# Patient Record
Sex: Female | Born: 2000 | Race: Black or African American | Hispanic: No | Marital: Single | Smoking: Former smoker
Health system: Southern US, Community
[De-identification: ages and names within clinical notes are randomized; demographics above are authoritative.]

## PROBLEM LIST (undated history)

## (undated) DIAGNOSIS — J45909 Unspecified asthma, uncomplicated: Secondary | ICD-10-CM

## (undated) DIAGNOSIS — Q0701 Arnold-Chiari syndrome with spina bifida: Secondary | ICD-10-CM

## (undated) DIAGNOSIS — U071 COVID-19: Secondary | ICD-10-CM

## (undated) DIAGNOSIS — R519 Headache, unspecified: Secondary | ICD-10-CM

## (undated) DIAGNOSIS — G901 Familial dysautonomia [Riley-Day]: Secondary | ICD-10-CM

## (undated) DIAGNOSIS — R51 Headache: Secondary | ICD-10-CM

## (undated) DIAGNOSIS — G9600 Cerebrospinal fluid leak, unspecified: Secondary | ICD-10-CM

## (undated) HISTORY — DX: Headache: R51

## (undated) HISTORY — DX: Headache, unspecified: R51.9

---
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
## 2021-05-14 ENCOUNTER — Other Ambulatory Visit: Payer: Self-pay

## 2021-05-14 ENCOUNTER — Ambulatory Visit (INDEPENDENT_AMBULATORY_CARE_PROVIDER_SITE_OTHER): Payer: BC Managed Care – PPO | Admitting: Family Medicine

## 2021-05-14 ENCOUNTER — Encounter: Payer: Self-pay | Admitting: Family Medicine

## 2021-05-14 VITALS — BP 120/80 | HR 66 | Temp 98.0°F | Ht 64.5 in | Wt 149.8 lb

## 2021-05-14 DIAGNOSIS — G935 Compression of brain: Secondary | ICD-10-CM | POA: Diagnosis not present

## 2021-05-14 DIAGNOSIS — G901 Familial dysautonomia [Riley-Day]: Secondary | ICD-10-CM | POA: Diagnosis not present

## 2021-05-14 DIAGNOSIS — Z7689 Persons encountering health services in other specified circumstances: Secondary | ICD-10-CM

## 2021-05-14 DIAGNOSIS — R630 Anorexia: Secondary | ICD-10-CM | POA: Diagnosis not present

## 2021-05-14 DIAGNOSIS — F32A Depression, unspecified: Secondary | ICD-10-CM

## 2021-05-14 DIAGNOSIS — F419 Anxiety disorder, unspecified: Secondary | ICD-10-CM | POA: Diagnosis not present

## 2021-05-14 NOTE — Patient Instructions (Addendum)
Chiari Malformation  Chiari malformation (CM) is a type of brain abnormality that affects the parts of the brain called the cerebellum and the brain stem. The cerebellum is important for balance, and the brain stem is important for basic body functions, such as breathing and swallowing. Normally, the cerebellum is located in a space at the back of the skull, just above the opening in the skull (foramen magnum)where the spinal cord meets the brain stem. With CM, part of the cerebellum is located below the foramen magnum instead. The malformation can be mild with no or few symptoms, or it can be severe. CM can cause neck pain, headaches, balance problems, and other symptoms. What are the causes? CM is a condition that a person is born with (congenital). In rare cases, CM may also develop later in life, which is called acquired CM or secondary CM. These cases may be caused by a leak of the fluid that surrounds and protects the brain and spinal cord (cerebrospinal fluid), leading to low pressure. In acquired or secondary CM, abnormal pressure develops in the brain. This pushes the cerebellum down into the foramen magnum. What increases the risk? The following factors may make you more likely to develop this condition:  Being female.  Having a family history of CM. What are the signs or symptoms? Symptoms of this condition may vary depending on the severity of your CM. In some cases, there are no symptoms. In other cases, symptoms may come and go. The most common symptom is a severe headache in the back of the head. The headache:  May come and go.  May spread to your neck and shoulders.  May be worse when you cough, sneeze, or strain. Other symptoms include:  Difficulty balancing.  Loss of coordination.  Vision problems, such as double vision, tiny spots moving across your vision (floaters), or sensitivity to lights.  Trouble swallowing or speaking.  Muscle weakness.  Feeling dizzy or  fainting.  Ringing in the ears.  Tiredness (fatigue).  Tingling or burning sensations in the fingers or toes.  Hearing problems.  Vomiting.  Inability to control when you urinate (incontinence). How is this diagnosed? This condition may be evaluated with a medical history and physical exam. This may include tests to check your balance and nerves (neurological exam). You may also have imaging tests, such as a CT scan or an MRI. How is this treated? Treatment for this condition depends on the severity of your symptoms. You may be treated with:  Surgery to prevent the malformation from getting worse, or to treat severe symptoms or symptoms that are getting worse.  Medicines or alternative treatments to relieve headaches or neck pain. If you do not have symptoms, you may not need treatment. Follow these instructions at home: Medicines  Take over-the-counter and prescription medicines only as told by your health care provider.  Ask your health care provider if the medicine prescribed to you requires you to avoid driving or using machinery.  If you are taking blood pressure or heart medicine, get up slowly and take several minutes to sit and then stand. This can reduce dizziness. General instructions  If you feel like you might faint: ? Lie down right away and raise (elevate) your feet above the level of your heart. ? Breathe deeply and steadily. Wait until all of the symptoms have passed.  Ask your health care provider which activities are safe for you and if you have any activity restrictions.  Do not use any  products that contain nicotine or tobacco. These products include cigarettes, chewing tobacco, and vaping devices, such as e-cigarettes. If you need help quitting, ask your health care provider.  Drink enough fluid to keep your urine pale yellow.  Consider joining a CM support group.  Keep all follow-up visits. This is important. Where to find more  information  [employer] of Neurological Disorders and Stroke: [employer] Contact a health care provider if:  You have new symptoms.  Your symptoms get worse. Get help right away if:  You develop weakness or numbness in one or all of your limbs.  You develop dizziness, slurred speech, double vision, weakness, or numbness with a severe headache. Summary  A Chiari malformation is a condition in which part of the cerebellum moves down through the foramen magnum.  The malformation can be mild with no symptoms, or it can be severe.  In some cases, no treatment is needed. In others, medicines are used to treat headaches. Surgery is done in the worst of cases. This information is not intended to replace advice given to you by your health care provider. Make sure you discuss any questions you have with your health care provider. Document Revised: 08/20/2020 Document Reviewed: 08/20/2020 Elsevier Patient Education  2021 [place], Adult Feeling a certain amount of stress is normal. Stress helps our body and mind get ready to deal with the demands of life. Stress hormones can motivate you to do well at work and meet your responsibilities. However severe or long-lasting (chronic) stress can affect your mental and physical health. Chronic stress puts you at higher risk for anxiety, depression, and other health problems like digestive problems, muscle aches, heart disease, high blood pressure, and stroke. What are the causes? Common causes of stress include:  Demands from work, such as deadlines, feeling overworked, or having long hours.  Pressures at home, such as money issues, disagreements with a spouse, or parenting issues.  Pressures from major life changes, such as divorce, moving, loss of a loved one, or chronic illness. You may be at higher risk for stress-related problems if you do not get enough sleep, are in poor health, do not have emotional  support, or have a mental health disorder like anxiety or depression. How to recognize stress Stress can make you:  Have trouble sleeping.  Feel sad, anxious, irritable, or overwhelmed.  Lose your appetite.  Overeat or want to eat unhealthy foods.  Want to use drugs or alcohol. Stress can also cause physical symptoms, such as:  Sore, tense muscles, especially in the shoulders and neck.  Headaches.  Trouble breathing.  A faster heart rate.  Stomach pain, nausea, or vomiting.  Diarrhea or constipation.  Trouble concentrating. Follow these instructions at home: Lifestyle  Identify the source of your stress and your reaction to it. See a therapist who can help you change your reactions.  When there are stressful events: ? Talk about it with family, friends, or co-workers. ? Try to think realistically about stressful events and not ignore them or overreact. ? Try to find the positives in a stressful situation and not focus on the negatives. ? Cut back on responsibilities at work and home, if possible. Ask for help from friends or family members if you need it.  Find ways to cope with stress, such as: ? Meditation. ? Deep breathing. ? Yoga or tai chi. ? Progressive muscle relaxation. ? Doing art, playing music, or reading. ? Making time for fun activities. ?  Spending time with family and friends.  Get support from family, friends, or spiritual resources. Eating and drinking  Eat a healthy diet. This includes: ? Eating foods that are high in fiber, such as beans, whole grains, and fresh fruits and vegetables. ? Limiting foods that are high in fat and processed sugars, such as fried and sweet foods.  Do not skip meals or overeat.  Drink enough fluid to keep your urine pale yellow. Alcohol use  Do not drink alcohol if: ? Your health care provider tells you not to drink. ? You are pregnant, may be pregnant, or are planning to become pregnant.  Drinking alcohol is  a way some people try to ease their stress. This can be dangerous, so if you drink alcohol: ? Limit how much you use to:  0-1 drink a day for women.  0-2 drinks a day for men. ? Be aware of how much alcohol is in your drink. In the [place] one drink equals one 12 oz bottle of beer (355 mL), one 5 oz glass of wine (148 mL), or one 1 oz glass of hard liquor (44 mL). Activity  Include 30 minutes of exercise in your daily schedule. Exercise is a good stress reducer.  Include time in your day for an activity that you find relaxing. Try taking a walk, going on a bike ride, reading a book, or listening to music.  Schedule your time in a way that lowers stress, and keep a consistent schedule. Prioritize what is most important to get done.   General instructions  Get enough sleep. Try to go to sleep and get up at about the same time every day.  Take over-the-counter and prescription medicines only as told by your health care provider.  Do not use any products that contain nicotine or tobacco, such as cigarettes, e-cigarettes, and chewing tobacco. If you need help quitting, ask your health care provider.  Do not use drugs or smoke to cope with stress.  Keep all follow-up visits as told by your health care provider. This is important. Where to find support  Talk with your health care provider about stress management or finding a support group.  Find a therapist to work with you on your stress management techniques. Contact a health care provider if:  Your stress symptoms get worse.  You are unable to manage your stress at home.  You are struggling to stop using drugs or alcohol. Get help right away if:  You may be a danger to yourself or others.  You have any thoughts of death or suicide. If you ever feel like you may hurt yourself or others, or have thoughts about taking your own life, get help right away. You can go to your nearest emergency department or call:  Your local emergency  services (911 in the [place]  A suicide crisis helpline, such as the [facility] at 215-417-5585. This is open 24 hours a day. Summary  Feeling a certain amount of stress is normal, but severe or long-lasting (chronic) stress can affect your mental and physical health.  Chronic stress can put you at higher risk for anxiety, depression, and other health problems like digestive problems, muscle aches, heart disease, high blood pressure, and stroke.  You may be at higher risk for stress-related problems if you do not get enough sleep, are in poor health, lack emotional support, or have a mental health disorder like anxiety or depression.  Identify the source of your stress and  your reaction to it. Try talking about stressful events with family, friends, or co-workers, finding a coping method, or getting support from spiritual resources.  If you need more help, talk with your health care provider about finding a support group or a mental health therapist. This information is not intended to replace advice given to you by your health care provider. Make sure you discuss any questions you have with your health care provider. Document Revised: 07/04/2019 Document Reviewed: 07/04/2019 Elsevier Patient Education  [employer].

## 2021-05-14 NOTE — Progress Notes (Addendum)
Patient presents to clinic today to establish care and f/u on chronic concerns.  SUBJECTIVE: PMH: Pt is a 20 yo female with pmh sig for Chiari malformation type II, asthma, dysautonomia. Pt previously seen by Jolaine Click, MD.  Pt notes h/o increased problems after having surgery for Chiari Malformation in 2017.  Pt followed by [facility], Cardiology, and GI.  Pt notes h/o decreased appetite, energy, HAs, vomiting, abdominal pain, and depression following her health issues.  Pt feeling somewhat better, but still with decreased appetite.  Doesn't feel hungry.  Was 161 lbs, now 149.8 lbs.  Pt interested in seeing a nutritionist.  Allergies: apples and carrots cause throat to feel itchy. Peanut oil-hives Midodrine-hives Lactose intolerant  Social hx: Pt is a Consulting civil engineer at [employer]  She is studying Psychology.  Pt denies EtOH, tobacco and drug use.  Past Medical History:  Diagnosis Date  . Asthma   . Chiari malformation type II (HCC)    I or II per patient  . COVID-19   . CSF leak   . Dysautonomia (HCC)   . Headache     Past Surgical History:  Procedure Laterality Date  . Chiari Decompression  05/2016    Current Outpatient Medications on File Prior to Visit  Medication Sig Dispense Refill  . [DISCONTINUED] famotidine (PEPCID) 20 MG tablet Take 1 tablet (20 mg total) by mouth 2 (two) times daily. (Patient not taking: No sig reported) 30 tablet 0  . [DISCONTINUED] Levonorgestrel (SKYLA) 13.5 MG IUD 1 each by Intrauterine route once. Implanted 2nd week of March 2021 (replace every 5 years     No current facility-administered medications on file prior to visit.    Allergies  Allergen Reactions  . Apple Itching    Reaction: itchy throat per patient  . Carrot [Daucus Carota] Itching    Reaction: itchy throat per patient  . Lactose Intolerance (Gi) Diarrhea  . Midodrine Hives  . Peanut Oil Hives    Family History  Problem Relation Age of Onset  . Hypertension Mother   .  Cancer Father   . Hypertension Father   . High blood pressure Brother     Social History   Socioeconomic History  . Marital status: Single    Spouse name: Not on file  . Number of children: Not on file  . Years of education: Not on file  . Highest education level: Not on file  Occupational History  . Not on file  Tobacco Use  . Smoking status: Former Smoker    Types: E-cigarettes  . Smokeless tobacco: Never Used  Vaping Use  . Vaping Use: Former  Substance and Sexual Activity  . Alcohol use: No  . Drug use: No  . Sexual activity: Not on file  Other Topics Concern  . Not on file  Social History Narrative   [place] is in 11th grade at [employer]. She is doing well.    Lives with her parents and older brother.    Enjoys playing volleyball, playing guitar, and singing.    Social Determinants of Health   Financial Resource Strain: Not on file  Food Insecurity: Not on file  Transportation Needs: Not on file  Physical Activity: Not on file  Stress: Not on file  Social Connections: Not on file  Intimate Partner Violence: Not on file    ROS General: Denies fever, chills, night sweats, changes in weight +changes in appetite HEENT: Denies headaches, ear pain, changes in vision, rhinorrhea, sore throat  CV: Denies CP, palpitations, SOB, orthopnea Pulm: Denies SOB, cough, wheezing GI: Denies abdominal pain, nausea, vomiting, diarrhea, constipation GU: Denies dysuria, hematuria, frequency, vaginal discharge Msk: Denies muscle cramps, joint pains Neuro: Denies weakness, numbness, tingling Skin: Denies rashes, bruising Psych: Denies anxiety, hallucinations  +h/o depression  BP 120/80 (BP Location: Right Arm, Patient Position: Sitting, Cuff Size: Normal)   Pulse 66   Temp 98 F (36.7 C) (Oral)   Ht 5' 4.5" (1.638 m)   Wt 149 lb 12.8 oz (67.9 kg)   LMP 04/22/2021 (Exact Date)   SpO2 99%   BMI 25.32 kg/m   Physical Exam Gen. Pleasant, well developed,  well-nourished, in NAD HEENT - /AT, PERRL, EOMI, conjunctive clear, no scleral icterus, no nasal drainage, pharynx without erythema or exudate.  TMs normal b/l. Lungs: no use of accessory muscles, CTAB, no wheezes, rales or rhonchi Cardiovascular: RRR, No r/g/m, no peripheral edema Abdomen: BS present, soft, nontender,nondistended Musculoskeletal: No deformities, moves all four extremities, no cyanosis or clubbing, normal tone Neuro:  A&Ox3, CN II-XII intact, normal gait Skin:  Warm, dry, intact, without rash Psych: normal affect, mood appropriate  No results found for this or any previous visit (from the past 2160 hour(s)).  Assessment/Plan: Chiari malformation type I [facility]) -s/p surgery in 2017 -followed by [facility]  -will place referral for local Neurologist if desired.  Decreased appetite  -discussed trying to eat small meals throughout the day -can also try drinking supplemental shakes -consider MVI -discussed nutritionist - Plan: Ambulatory referral to diabetic education  Dysautonomia (HCC) -midodrine caused hives -followed by [facility] Cardiac Electrophysiology, Dr. Meryle Ready -will provide referral to adult Cardiologist in area if needed.  Anxiety and depression -PHQ 9 score 12 -GAD 7 score 10 -discussed various options including counseling and medication. -given info for area [facility] providers. -given precautions  Encounter to establish care -We reviewed the PMH, PSH, FH, SH, Meds and Allergies. -We provided refills for any medications we will prescribe as needed. -We addressed current concerns per orders and patient instructions. -We have asked for records for pertinent exams, studies, vaccines and notes from previous providers. -We have advised patient to follow up per instructions below.  F/u in 6-8 wks, sooner if needed.  Abbe Amsterdam, MD

## 2021-05-26 ENCOUNTER — Encounter: Payer: Self-pay | Admitting: Family Medicine

## 2021-05-26 DIAGNOSIS — R630 Anorexia: Secondary | ICD-10-CM | POA: Insufficient documentation

## 2021-05-26 DIAGNOSIS — G901 Familial dysautonomia [Riley-Day]: Secondary | ICD-10-CM | POA: Insufficient documentation

## 2021-05-26 DIAGNOSIS — F419 Anxiety disorder, unspecified: Secondary | ICD-10-CM | POA: Insufficient documentation

## 2021-06-18 ENCOUNTER — Ambulatory Visit: Payer: BC Managed Care – PPO | Admitting: Registered"

## 2021-07-08 ENCOUNTER — Ambulatory Visit (INDEPENDENT_AMBULATORY_CARE_PROVIDER_SITE_OTHER): Payer: BC Managed Care – PPO | Admitting: Family Medicine

## 2021-07-08 ENCOUNTER — Other Ambulatory Visit: Payer: Self-pay

## 2021-07-08 ENCOUNTER — Encounter: Payer: Self-pay | Admitting: Family Medicine

## 2021-07-08 VITALS — BP 104/72 | HR 78 | Temp 98.4°F | Ht 65.0 in | Wt 144.2 lb

## 2021-07-08 DIAGNOSIS — F419 Anxiety disorder, unspecified: Secondary | ICD-10-CM

## 2021-07-08 DIAGNOSIS — R1032 Left lower quadrant pain: Secondary | ICD-10-CM | POA: Diagnosis not present

## 2021-07-08 DIAGNOSIS — F32A Depression, unspecified: Secondary | ICD-10-CM

## 2021-07-08 DIAGNOSIS — R1012 Left upper quadrant pain: Secondary | ICD-10-CM | POA: Diagnosis not present

## 2021-07-08 DIAGNOSIS — Z13220 Encounter for screening for lipoid disorders: Secondary | ICD-10-CM

## 2021-07-08 DIAGNOSIS — Z0001 Encounter for general adult medical examination with abnormal findings: Secondary | ICD-10-CM | POA: Diagnosis not present

## 2021-07-08 DIAGNOSIS — R002 Palpitations: Secondary | ICD-10-CM | POA: Diagnosis not present

## 2021-07-08 DIAGNOSIS — Z0000 Encounter for general adult medical examination without abnormal findings: Secondary | ICD-10-CM

## 2021-07-08 LAB — COMPREHENSIVE METABOLIC PANEL
ALT: 14 U/L (ref 0–35)
AST: 22 U/L (ref 0–37)
Albumin: 4.1 g/dL (ref 3.5–5.2)
Alkaline Phosphatase: 49 U/L (ref 47–119)
BUN: 12 mg/dL (ref 6–23)
CO2: 23 mEq/L (ref 19–32)
Calcium: 9.1 mg/dL (ref 8.4–10.5)
Chloride: 109 mEq/L (ref 96–112)
Creatinine, Ser: 0.92 mg/dL (ref 0.40–1.20)
GFR: 90.35 mL/min (ref >60.00)
Glucose, Bld: 79 mg/dL (ref 70–99)
Potassium: 3.8 mEq/L (ref 3.5–5.1)
Sodium: 140 mEq/L (ref 135–145)
Total Bilirubin: 1.2 mg/dL (ref 0.2–1.2)
Total Protein: 7 g/dL (ref 6.0–8.3)

## 2021-07-08 LAB — LIPID PANEL
Cholesterol: 126 mg/dL (ref 0–200)
HDL: 60.9 mg/dL
LDL Cholesterol: 59 mg/dL (ref 0–99)
NonHDL: 65.04
Total CHOL/HDL Ratio: 2
Triglycerides: 32 mg/dL (ref 0.0–149.0)
VLDL: 6.4 mg/dL (ref 0.0–40.0)

## 2021-07-08 LAB — CBC WITH DIFFERENTIAL/PLATELET
Basophils Absolute: 0.1 10*3/uL (ref 0.0–0.1)
Basophils Relative: 1.2 % (ref 0.0–3.0)
Eosinophils Absolute: 0.1 10*3/uL (ref 0.0–0.7)
Eosinophils Relative: 2 % (ref 0.0–5.0)
HCT: 38 % (ref 36.0–49.0)
Hemoglobin: 12.6 g/dL (ref 12.0–16.0)
Lymphocytes Relative: 35.5 % (ref 24.0–48.0)
Lymphs Abs: 2.1 10*3/uL (ref 0.7–4.0)
MCHC: 33.3 g/dL (ref 31.0–37.0)
MCV: 83.4 fl (ref 78.0–98.0)
Monocytes Absolute: 0.5 10*3/uL (ref 0.1–1.0)
Monocytes Relative: 7.8 % (ref 3.0–12.0)
Neutro Abs: 3.1 10*3/uL (ref 1.4–7.7)
Neutrophils Relative %: 53.5 % (ref 43.0–71.0)
Platelets: 200 10*3/uL (ref 150.0–575.0)
RBC: 4.55 Mil/uL (ref 3.80–5.70)
RDW: 14.5 % (ref 11.4–15.5)
WBC: 5.9 10*3/uL (ref 4.5–13.5)

## 2021-07-08 LAB — POCT URINALYSIS DIPSTICK
Bilirubin, UA: NEGATIVE
Blood, UA: NEGATIVE
Glucose, UA: NEGATIVE
Ketones, UA: NEGATIVE
Leukocytes, UA: NEGATIVE
Protein, UA: POSITIVE — AB
Spec Grav, UA: 1.025
Urobilinogen, UA: NEGATIVE U/dL — AB
pH, UA: 6

## 2021-07-08 LAB — TSH: TSH: 1.61 u[IU]/mL (ref 0.40–5.00)

## 2021-07-08 LAB — T4, FREE: Free T4: 1.01 ng/dL (ref 0.60–1.60)

## 2021-07-08 LAB — HEMOGLOBIN A1C: Hgb A1c MFr Bld: 5.3 % (ref 4.6–6.5)

## 2021-07-08 NOTE — Progress Notes (Signed)
Subjective:     Chloe Sanders is a 20 y.o. female and is here for a comprehensive physical exam. Not fasting.  States doing ok.  Reports abd pain and nausea.  Notes cramping/pain prior to start of menses which typically last 4-5 days.  Stopped OCPs in January.  Endorses intermittent palpitations.  Denies CP.  Social History   Socioeconomic History   Marital status: Single    Spouse name: Not on file   Number of children: Not on file   Years of education: Not on file   Highest education level: Not on file  Occupational History   Not on file  Tobacco Use   Smoking status: Former    Types: E-cigarettes   Smokeless tobacco: Never  Vaping Use   Vaping Use: Former  Substance and Sexual Activity   Alcohol use: No   Drug use: No   Sexual activity: Not on file  Other Topics Concern   Not on file  Social History Narrative   Chloe Sanders is in 11th grade at [employer]. She is doing well.    Lives with her parents and older brother.    Enjoys playing volleyball, playing guitar, and singing.    Social Determinants of Health   Financial Resource Strain: Not on file  Food Insecurity: Not on file  Transportation Needs: Not on file  Physical Activity: Not on file  Stress: Not on file  Social Connections: Not on file  Intimate Partner Violence: Not on file   Health Maintenance  Topic Date Due   COVID-19 Vaccine (1) Never done   Pneumococcal Vaccine 4-46 Years old (1 - PCV) Never done   HPV VACCINES (1 - 2-dose series) Never done   HIV Screening  Never done   Hepatitis C Screening  Never done   TETANUS/TDAP  Never done   INFLUENZA VACCINE  07/20/2021    The following portions of the patient's history were reviewed and updated as appropriate: allergies, current medications, past family history, past medical history, past social history, past surgical history, and problem list.  Review of Systems Pertinent items noted in HPI and remainder of comprehensive ROS otherwise  negative.   Objective:    BP 104/72 (BP Location: Right Arm, Patient Position: Sitting, Cuff Size: Normal)   Pulse 78   Temp 98.4 F (36.9 C) (Oral)   Ht 5\' 5"  (1.651 m)   Wt 144 lb 3.2 oz (65.4 kg)   LMP 06/15/2021   SpO2 99%   BMI 24.00 kg/m  General appearance: alert, cooperative, and no distress Head: Normocephalic, without obvious abnormality, atraumatic Eyes: conjunctivae/corneas clear. PERRL, EOM's intact. Fundi benign. Ears: normal TM's and external ear canals both ears Nose: Nares normal. Septum midline. Mucosa normal. No drainage or sinus tenderness. Throat: lips, mucosa, and tongue normal; teeth and gums normal Neck: no adenopathy, no carotid bruit, no JVD, supple, symmetrical, trachea midline, and thyroid not enlarged, symmetric, no tenderness/mass/nodules Lungs: clear to auscultation bilaterally Heart: regular rate and rhythm, S1, S2 normal, no murmur, click, rub or gallop Abdomen:  BS present, soft, ND, TTP in LUQ and LLQ , no hepatosplenomegaly. Extremities: extremities normal, atraumatic, no cyanosis or edema Pulses: 2+ and symmetric Skin: Skin color, texture, turgor normal. No rashes or lesions Lymph nodes: Cervical, supraclavicular, and axillary nodes normal. Neurologic: Alert and oriented X 3, normal strength and tone. Normal symmetric reflexes. Normal coordination and gait    Assessment:    Healthy female exam with anxiety, depression, LUQ and LLQ pain.  Plan:    Anticipatory guidance given including wearing seatbelts, smoke detectors in the home, increasing physical activity, increasing p.o. intake of water and vegetables. -labs -given handout -next CPE in 1 yr See After Visit Summary for Counseling Recommendations   Palpitations -EKG obtained this visit NSR.  Compared to previous EKG from 01/18/2019. -Discussed limiting caffeine intake -Obtain labs - Plan: EKG 12-Lead, CBC with Differential/Platelet, TSH, T4, Free, Hemoglobin A1c  Anxiety and  depression  -PHQ 9 score 12 -GAD 7 score 14 -discussed self care, counseling, meds.  Give info on area [facility] providers -Wishes to wait on medications -given strict precautions -Follow-up in 4 weeks, sooner if needed - Plan: TSH, T4, Free  Left lower quadrant abdominal pain -Discussed possible causes including constipation -Bowel regimen if needed -For continued or worsening symptoms follow-up with GI - Plan: CMP, POCT urinalysis dipstick  Left upper quadrant abdominal pain  - Plan: CMP, POCT urinalysis dipstick  Screening for cholesterol level -not fasting. - Plan: Lipid panel  Follow-up in 4-6 weeks, sooner if needed for anxiety, depression  Abbe Amsterdam, MD

## 2021-07-29 ENCOUNTER — Encounter: Payer: Self-pay | Admitting: Family Medicine

## 2021-08-07 ENCOUNTER — Emergency Department (HOSPITAL_COMMUNITY)
Admission: EM | Admit: 2021-08-07 | Discharge: 2021-08-07 | Disposition: A | Discharge status: Home / Self Care | Payer: BC Managed Care – PPO | Attending: Emergency Medicine | Admitting: Emergency Medicine

## 2021-08-07 ENCOUNTER — Emergency Department (HOSPITAL_COMMUNITY): Payer: BC Managed Care – PPO

## 2021-08-07 ENCOUNTER — Encounter (HOSPITAL_COMMUNITY): Payer: Self-pay | Admitting: Emergency Medicine

## 2021-08-07 ENCOUNTER — Other Ambulatory Visit: Payer: Self-pay

## 2021-08-07 DIAGNOSIS — Z87891 Personal history of nicotine dependence: Secondary | ICD-10-CM | POA: Insufficient documentation

## 2021-08-07 DIAGNOSIS — J45909 Unspecified asthma, uncomplicated: Secondary | ICD-10-CM | POA: Diagnosis not present

## 2021-08-07 DIAGNOSIS — Y9368 Activity, volleyball (beach) (court): Secondary | ICD-10-CM | POA: Insufficient documentation

## 2021-08-07 DIAGNOSIS — S0990XA Unspecified injury of head, initial encounter: Secondary | ICD-10-CM | POA: Diagnosis not present

## 2021-08-07 DIAGNOSIS — Z91010 Allergy to peanuts: Secondary | ICD-10-CM | POA: Diagnosis not present

## 2021-08-07 DIAGNOSIS — S0501XA Injury of conjunctiva and corneal abrasion without foreign body, right eye, initial encounter: Secondary | ICD-10-CM | POA: Insufficient documentation

## 2021-08-07 DIAGNOSIS — W2106XA Struck by volleyball, initial encounter: Secondary | ICD-10-CM | POA: Diagnosis not present

## 2021-08-07 DIAGNOSIS — Z8616 Personal history of COVID-19: Secondary | ICD-10-CM | POA: Diagnosis not present

## 2021-08-07 DIAGNOSIS — S0591XA Unspecified injury of right eye and orbit, initial encounter: Secondary | ICD-10-CM | POA: Diagnosis present

## 2021-08-07 DIAGNOSIS — M62838 Other muscle spasm: Secondary | ICD-10-CM | POA: Diagnosis not present

## 2021-08-07 MED ORDER — METHOCARBAMOL 500 MG PO TABS: 500.0000 mg | ORAL_TABLET | Freq: Two times a day (BID) | ORAL | 0 refills | Status: DC

## 2021-08-07 MED ORDER — ACETAMINOPHEN 325 MG PO TABS
650.0000 mg | ORAL_TABLET | Freq: Once | ORAL | Status: AC
  Administered 2021-08-07: 650 mg via ORAL
  Filled 2021-08-07: qty 2

## 2021-08-07 MED ORDER — FLUORESCEIN SODIUM 1 MG OP STRP
1.0000 | ORAL_STRIP | Freq: Once | OPHTHALMIC | Status: AC
  Administered 2021-08-07: 1 via OPHTHALMIC
  Filled 2021-08-07: qty 1

## 2021-08-07 MED ORDER — LIDOCAINE 5 % EX PTCH: 1.0000 | MEDICATED_PATCH | CUTANEOUS | 0 refills | Status: DC

## 2021-08-07 MED ORDER — LIDOCAINE 5 % EX PTCH
1.0000 | MEDICATED_PATCH | CUTANEOUS | Status: DC
  Administered 2021-08-07: 1 via TRANSDERMAL
  Filled 2021-08-07: qty 1

## 2021-08-07 MED ORDER — ERYTHROMYCIN 5 MG/GM OP OINT: TOPICAL_OINTMENT | OPHTHALMIC | 0 refills | Status: DC

## 2021-08-07 NOTE — ED Provider Notes (Signed)
[facility] EMERGENCY DEPARTMENT Provider Note   CSN: [id number] Arrival date & time: 08/07/21  1329     History Chief Complaint  Patient presents with   Head Injury    Chloe Sanders is a 20 y.o. female.  Patient is a 20 yo female presenting for headache. Patient states this past Monday, 5 days ago, she was hit on the left side of her face/neck by a volley ball. States she went down but did not loose consciousness. States she has residual right sided neck pain with associated headache. Admits to intermittent blurred vision but denies eye pain or sensation of foreign body in eye.   The history is provided by the patient. No language interpreter was used.  Head Injury Location:  Generalized Pain details:    Quality:  Aching Chronicity:  New Relieved by:  Nothing Associated symptoms: headache and neck pain   Associated symptoms: no seizures and no vomiting       Past Medical History:  Diagnosis Date   Asthma    Chiari malformation type II (HCC)    I or II per patient   COVID-19    CSF leak    Dysautonomia (HCC)    Headache     Patient Active Problem List   Diagnosis Date Noted   Dysautonomia (HCC) 05/26/2021   Anxiety and depression 05/26/2021   Decreased appetite 05/26/2021   Cervical paraspinal muscle spasm 05/05/2017   Migraine without aura and without status migrainosus, not intractable 12/26/2015   Chiari malformation type I (HCC) 12/26/2015   Tension headache 12/26/2015    Past Surgical History:  Procedure Laterality Date   Chiari Decompression  05/2016     OB History   No obstetric history on file.     Family History  Problem Relation Age of Onset   Hypertension Mother    Cancer Father    Hypertension Father    High blood pressure Brother     Social History   Tobacco Use   Smoking status: Former    Types: E-cigarettes   Smokeless tobacco: Never  Building services engineer Use: Former  Substance Use Topics   Alcohol use: No    Drug use: No    Home Medications Prior to Admission medications   Medication Sig Start Date End Date Taking? Authorizing Provider  acetaminophen (TYLENOL) 325 MG tablet Take 650-975 mg by mouth every 6 (six) hours as needed for fever, mild pain or headache.   Yes [provider]  ibuprofen (ADVIL) 200 MG tablet Take 400-600 mg by mouth every 6 (six) hours as needed for headache, fever or mild pain.   Yes [provider]  famotidine (PEPCID) 20 MG tablet Take 1 tablet (20 mg total) by mouth 2 (two) times daily. Patient not taking: No sig reported 01/26/20 12/17/20  Lorin Picket, NP  Levonorgestrel (SKYLA) 13.5 MG IUD 1 each by Intrauterine route once. Implanted 2nd week of March 2021 (replace every 5 years  12/17/20  [provider]    Allergies    Apple, Carrot [daucus carota], Lactose intolerance (gi), Midodrine, and Peanut oil  Review of Systems   Review of Systems  Constitutional:  Negative for chills and fever.  HENT:  Negative for ear pain and sore throat.   Eyes:  Positive for visual disturbance. Negative for pain.  Respiratory:  Negative for cough and shortness of breath.   Cardiovascular:  Negative for chest pain and palpitations.  Gastrointestinal:  Negative for abdominal  pain and vomiting.  Genitourinary:  Negative for dysuria and hematuria.  Musculoskeletal:  Positive for neck pain. Negative for arthralgias and back pain.  Skin:  Negative for color change and rash.  Neurological:  Positive for headaches. Negative for seizures and syncope.  All other systems reviewed and are negative.  Physical Exam Updated Vital Signs BP 130/80 (BP Location: Right Arm)   Pulse 67   Temp 97.9 F (36.6 C) (Oral)   Resp 18   LMP 08/04/2021   SpO2 100%   Physical Exam Vitals and nursing note reviewed.  Constitutional:      General: She is not in acute distress.    Appearance: She is well-developed.  HENT:     Head: Normocephalic and atraumatic.   Eyes:     General: Lids are normal. Vision grossly intact. Gaze aligned appropriately.     Conjunctiva/sclera: Conjunctivae normal.     Pupils: Pupils are equal, round, and reactive to light.     Right eye: Fluorescein uptake present.     Visual Fields: Right eye visual fields normal and left eye visual fields normal.     Comments: Corneal abrasion right eye   Cardiovascular:     Rate and Rhythm: Normal rate and regular rhythm.     Heart sounds: No murmur heard. Pulmonary:     Effort: Pulmonary effort is normal. No respiratory distress.     Breath sounds: Normal breath sounds.  Abdominal:     Palpations: Abdomen is soft.     Tenderness: There is no abdominal tenderness.  Musculoskeletal:     Cervical back: Neck supple.  Skin:    General: Skin is warm and dry.  Neurological:     Mental Status: She is alert.     GCS: GCS eye subscore is 4. GCS verbal subscore is 5. GCS motor subscore is 6.     Cranial Nerves: Cranial nerves are intact.     Sensory: Sensation is intact.     Motor: Motor function is intact.     Coordination: Coordination is intact.    ED Results / Procedures / Treatments   Labs (all labs ordered are listed, but only abnormal results are displayed) Labs Reviewed - No data to display  EKG None  Radiology CT HEAD WO CONTRAST ( )  Result Date: 08/07/2021 CLINICAL DATA:  Head trauma, mod-severe. Headache and eye pain after being hip with volleyball 4 days ago. History of prior decompression for Chiari malformation. EXAM: CT HEAD WITHOUT CONTRAST TECHNIQUE: Contiguous axial images were obtained from the base of the skull through the vertex without intravenous contrast. COMPARISON:  01/15/2019 FINDINGS: Brain: No evidence of acute infarction, hemorrhage, hydrocephalus, extra-axial collection or mass lesion/mass effect. Vascular: No hyperdense vessel or unexpected calcification. Skull: Prior suboccipital craniotomy for Chiari malformation. Negative for acute  calvarial fracture. Sinuses/Orbits: No acute finding. Other: Negative for scalp hematoma. IMPRESSION: No acute intracranial findings. Electronically Signed   By: Duanne Guess D.O.   On: 08/07/2021 15:06    Procedures Procedures   Medications Ordered in ED Medications - No data to display  ED Course  I have reviewed the triage vital signs and the nursing notes.  Pertinent labs & imaging results that were available during my care of the patient were reviewed by me and considered in my medical decision making (see chart for details).    MDM Rules/Calculators/A&P  6:16 PM 20 yo female presenting for headache and blurred vision after volley ball injury. Patient is Aox3, no acute distress, afebrile, with stable vitals. Physical exam demonstrates no neurovascular deficits. CT head stable. Right eye demonstrates corneal abrasion with fluorescein staining likely cause of blurred vision. No pain with EOM. No ecchymosis or swelling or orbits. Prescription for erythrocin ointment and follow up with ophthalmology provided.   Patient also had right sided cervical muscle spasm. Robaxin and motrin provided.   Patient in no distress and overall condition improved here in the ED. Detailed discussions were had with the patient regarding current findings, and need for close f/u with PCP or on call doctor. The patient has been instructed to return immediately if the symptoms worsen in any way for re-evaluation. Patient verbalized understanding and is in agreement with current care plan. All questions answered prior to discharge.    Final Clinical Impression(s) / ED Diagnoses Final diagnoses:  Injury of head, initial encounter  Cervical Muscle spasm  Abrasion of right cornea, initial encounter    Rx / DC Orders ED Discharge Orders     None        Franne Forts, DO 08/08/21 0123

## 2021-08-07 NOTE — ED Notes (Signed)
Pt verbalized understanding of d/c instructions, meds and followup care. Denies questions. VSS, no distress noted. Steady gait to exit.  

## 2021-08-07 NOTE — ED Notes (Signed)
EDP with woods lamp, fluorescein at this time. RN gave APAP PO. Pt denies needs, no distress.

## 2021-08-07 NOTE — ED Triage Notes (Signed)
Patient hit in face on right eye with a volleyball on Monday, continued to play after impact. Now complains of bilateral eye pain, headache, and nausea. Patient alert, oriented, and ambulatory at this time. Reports getting hit in the head five weeks ago with a volleyball and experiencing similar symptoms that resolved on their own.This time symptoms are worse.

## 2021-08-07 NOTE — ED Provider Notes (Signed)
Emergency Medicine Provider Triage Evaluation Note  Chloe Sanders , a 20 y.o. female  was evaluated in triage.    Patient with history of Chiari malformation s/p surgery presented today with headache since Monday now on day 5 of headache.  She states has been getting progressively worse.  She states that it occurred right after she was bending head with a volleyball.  She states she initially attempted to sleep off the pain however feels it is getting much worse.  Denies any weakness or numbness or vision changes.  Has a history of lumbar puncture however this was done much earlier in the year.  Review of Systems  Positive: headache Negative: fever  Physical Exam  BP (!) 135/91 (BP Location: Right Arm)   Pulse 64   Temp 98.6 F (37 C) (Oral)   Resp 14   LMP 08/04/2021   SpO2 100%  Gen:   Awake, no distress   Resp:  Normal effort  MSK:   Moves extremities without difficulty  Other:  Smile symmetric.  Moves all 4 extremities with appropriate and symmetric strength.  Speaking full sentences.  ANO x3 sensation in all 4 extremities is symmetric.  Medical Decision Making  Medically screening exam initiated at 1:41 PM.  Appropriate orders placed.  Chloe Sanders was informed that the remainder of the evaluation will be completed by another provider, this initial triage assessment does not replace that evaluation, and the importance of remaining in the ED until their evaluation is complete.  Patient with history of Chiari malformation s/p surgery presented today with headache since Monday now on day 5 of headache.  She states has been getting progressively worse.  She states that it occurred right after she was bending head with a volleyball.  She states she initially attempted to sleep off the pain however feels it is getting much worse.  Denies any weakness or numbness or vision changes.  Has a history of lumbar puncture however this was done much earlier in the year.   Gailen Shelter,  [place] 08/07/21 1346    Cheryll Cockayne, MD 08/07/21 1445

## 2021-10-16 ENCOUNTER — Encounter (HOSPITAL_BASED_OUTPATIENT_CLINIC_OR_DEPARTMENT_OTHER): Payer: Self-pay | Admitting: Emergency Medicine

## 2021-10-16 ENCOUNTER — Other Ambulatory Visit: Payer: Self-pay

## 2021-10-16 ENCOUNTER — Emergency Department (HOSPITAL_BASED_OUTPATIENT_CLINIC_OR_DEPARTMENT_OTHER)
Admission: EM | Admit: 2021-10-16 | Discharge: 2021-10-16 | Disposition: A | Discharge status: Home / Self Care | Payer: BC Managed Care – PPO | Attending: Emergency Medicine | Admitting: Emergency Medicine

## 2021-10-16 DIAGNOSIS — Z8616 Personal history of COVID-19: Secondary | ICD-10-CM | POA: Diagnosis not present

## 2021-10-16 DIAGNOSIS — Z87891 Personal history of nicotine dependence: Secondary | ICD-10-CM | POA: Insufficient documentation

## 2021-10-16 DIAGNOSIS — Z91010 Allergy to peanuts: Secondary | ICD-10-CM | POA: Diagnosis not present

## 2021-10-16 DIAGNOSIS — J029 Acute pharyngitis, unspecified: Secondary | ICD-10-CM | POA: Diagnosis not present

## 2021-10-16 DIAGNOSIS — Z20822 Contact with and (suspected) exposure to covid-19: Secondary | ICD-10-CM | POA: Insufficient documentation

## 2021-10-16 DIAGNOSIS — J45909 Unspecified asthma, uncomplicated: Secondary | ICD-10-CM | POA: Insufficient documentation

## 2021-10-16 DIAGNOSIS — J3489 Other specified disorders of nose and nasal sinuses: Secondary | ICD-10-CM | POA: Diagnosis not present

## 2021-10-16 DIAGNOSIS — R519 Headache, unspecified: Secondary | ICD-10-CM | POA: Diagnosis not present

## 2021-10-16 DIAGNOSIS — R509 Fever, unspecified: Secondary | ICD-10-CM | POA: Insufficient documentation

## 2021-10-16 DIAGNOSIS — J069 Acute upper respiratory infection, unspecified: Secondary | ICD-10-CM

## 2021-10-16 DIAGNOSIS — R059 Cough, unspecified: Secondary | ICD-10-CM | POA: Diagnosis not present

## 2021-10-16 LAB — RESP PANEL BY RT-PCR (FLU A&B, COVID) ARPGX2
Influenza A by PCR: NEGATIVE
Influenza B by PCR: NEGATIVE
SARS Coronavirus 2 by RT PCR: NEGATIVE

## 2021-10-16 MED ORDER — DIPHENHYDRAMINE HCL 50 MG/ML IJ SOLN
50.0000 mg | Freq: Once | INTRAMUSCULAR | Status: AC
  Administered 2021-10-16: 50 mg via INTRAVENOUS
  Filled 2021-10-16: qty 1

## 2021-10-16 MED ORDER — METOCLOPRAMIDE HCL 5 MG/ML IJ SOLN
10.0000 mg | Freq: Once | INTRAMUSCULAR | Status: AC
  Administered 2021-10-16: 10 mg via INTRAVENOUS
  Filled 2021-10-16: qty 2

## 2021-10-16 MED ORDER — BENZONATATE 100 MG PO CAPS: 100.0000 mg | ORAL_CAPSULE | Freq: Three times a day (TID) | ORAL | 0 refills | Status: DC

## 2021-10-16 NOTE — Discharge Instructions (Signed)
You came to the emergency department today to be evaluated for your headache, runny nose, cough and sore throat.  Your COVID-19 and influenza test were negative.  Your symptoms are likely due to a viral upper respiratory infection.  I have given you prescription for Tessalon to help with your cough, please take as prescribed.  Please take Ibuprofen (Advil, motrin) and Tylenol (acetaminophen) to relieve your pain.    You may take up to 600 MG (3 pills) of normal strength ibuprofen every 8 hours as needed.   You make take tylenol, up to 1,000 mg (two extra strength pills) every 8 hours as needed.   It is safe to take ibuprofen and tylenol at the same time as they work differently.   Do not take more than 3,000 mg tylenol in a 24 hour period (not more than one dose every 8 hours.  Please check all medication labels as many medications such as pain and cold medications may contain tylenol.  Do not drink alcohol while taking these medications.  Do not take other NSAID'S while taking ibuprofen (such as aleve or naproxen).  Please take ibuprofen with food to decrease stomach upset.  Get help right away if: You have shortness of breath that gets worse. You have severe or persistent: Headache. Ear pain. Sinus pain. Chest pain. You have chronic lung disease along with any of the following: Wheezing. Prolonged cough. Coughing up blood. A change in your usual mucus. You have a stiff neck. You have changes in your: Vision. Hearing. Thinking. Mood.

## 2021-10-16 NOTE — ED Notes (Signed)
Patient verbalizes understanding of discharge instructions. Opportunity for questioning and answers were provided. Patient discharged from ED.  °

## 2021-10-16 NOTE — ED Provider Notes (Addendum)
[facility] EMERGENCY DEPT Provider Note   CSN: [id number] Arrival date & time: 10/16/21  0941     History Chief Complaint  Patient presents with   Headache   Fever    Chloe Sanders is a 20 y.o. female with a history of asthma, Chiari malformation type II, dysautonomia.  Presents to the emergency department with a chief complaint of headache, rhinorrhea, sore throat, and cough.  Patient reports that her symptoms started on 10/24.  Symptoms have been constant since then.  Patient reports that headache is generalized throughout her entire head.  At onset pain was gradual and progressively worse over time.  At present patient rates pain 9/10 on the pain scale.  Patient endorses phobia.  Patient denies any change in pain with exertion.  Reports minimal relief of symptoms with Tylenol and Motrin.  Denies any numbness, weakness, visual disturbance, facial asymmetry, dysarthria.  No recent falls or injuries.  Patient endorses productive cough.  Cough is producing yellow mucus.  Endorses rhinorrhea, sore throat, and fever.  Patient reports that sore throat is worse in the morning.  Patient denies any trouble swallowing, drooling, high potato voice, or trismus.  T-max of 100 F temporally this morning.  Patient has been vaccinated for COVID-19 and received booster.  Patient has not been vaccinated for influenza.  Patient reports that her father was recently sick with similar symptoms however he was negative for COVID-19 and influenza.  LMP 3 weeks prior.  Patient reports she is not sexually active.   Headache Associated symptoms: cough, fever and sore throat   Associated symptoms: no abdominal pain, no back pain, no congestion, no diarrhea, no dizziness, no nausea, no neck pain, no numbness, no seizures, no vomiting and no weakness   Fever Associated symptoms: cough, headaches, rhinorrhea and sore throat   Associated symptoms: no chest pain, no chills, no confusion, no congestion,  no diarrhea, no dysuria, no nausea, no rash and no vomiting       Past Medical History:  Diagnosis Date   Asthma    Chiari malformation type II (HCC)    I or II per patient   COVID-19    CSF leak    Dysautonomia (HCC)    Headache     Patient Active Problem List   Diagnosis Date Noted   Dysautonomia (HCC) 05/26/2021   Anxiety and depression 05/26/2021   Decreased appetite 05/26/2021   Cervical paraspinal muscle spasm 05/05/2017   Migraine without aura and without status migrainosus, not intractable 12/26/2015   Chiari malformation type I (HCC) 12/26/2015   Tension headache 12/26/2015    Past Surgical History:  Procedure Laterality Date   Chiari Decompression  05/2016     OB History   No obstetric history on file.     Family History  Problem Relation Age of Onset   Hypertension Mother    Cancer Father    Hypertension Father    High blood pressure Brother     Social History   Tobacco Use   Smoking status: Former    Types: E-cigarettes   Smokeless tobacco: Never  Building services engineer Use: Former  Substance Use Topics   Alcohol use: No   Drug use: No    Home Medications Prior to Admission medications   Medication Sig Start Date End Date Taking? Authorizing Provider  acetaminophen (TYLENOL) 325 MG tablet Take 650-975 mg by mouth every 6 (six) hours as needed for fever, mild pain or headache.    [provider]  erythromycin ophthalmic ointment Place a 1/2 inch ribbon of ointment into the lower eyelid. 08/07/21   Edwin Dada P, DO  ibuprofen (ADVIL) 200 MG tablet Take 400-600 mg by mouth every 6 (six) hours as needed for headache, fever or mild pain.    [provider]  lidocaine (LIDODERM) 5 % Place 1 patch onto the skin daily. Remove & Discard patch within 12 hours or as directed by MD 08/07/21   Edwin Dada P, DO  methocarbamol (ROBAXIN) 500 MG tablet Take 1 tablet (500 mg total) by mouth 2 (two) times daily. 08/07/21   Edwin Dada P, DO   famotidine (PEPCID) 20 MG tablet Take 1 tablet (20 mg total) by mouth 2 (two) times daily. Patient not taking: No sig reported 01/26/20 12/17/20  Lorin Picket, NP  Levonorgestrel (SKYLA) 13.5 MG IUD 1 each by Intrauterine route once. Implanted 2nd week of March 2021 (replace every 5 years  12/17/20  [provider]    Allergies    Apple, Carrot [daucus carota], Lactose intolerance (gi), Midodrine, and Peanut oil  Review of Systems   Review of Systems  Constitutional:  Positive for fever. Negative for chills.  HENT:  Positive for rhinorrhea and sore throat. Negative for congestion, drooling, trouble swallowing and voice change.   Eyes:  Negative for visual disturbance.  Respiratory:  Positive for cough. Negative for shortness of breath.   Cardiovascular:  Negative for chest pain, palpitations and leg swelling.  Gastrointestinal:  Negative for abdominal pain, diarrhea, nausea and vomiting.  Genitourinary:  Negative for difficulty urinating, dysuria, frequency, genital sores, hematuria and urgency.  Musculoskeletal:  Negative for back pain and neck pain.  Skin:  Negative for color change and rash.  Neurological:  Positive for headaches. Negative for dizziness, tremors, seizures, syncope, facial asymmetry, speech difficulty, weakness, light-headedness and numbness.  Psychiatric/Behavioral:  Negative for confusion.    Physical Exam Updated Vital Signs BP 116/78   Pulse 68   Temp 98.3 F (36.8 C) (Oral)   Resp 18   Ht 5\' 4"  (1.626 m)   Wt 66.7 kg   SpO2 100%   BMI 25.23 kg/m   Physical Exam Vitals and nursing note reviewed.  Constitutional:      General: She is not in acute distress.    Appearance: She is ill-appearing. She is not toxic-appearing or diaphoretic.  HENT:     Head: Normocephalic and atraumatic.     Jaw: There is normal jaw occlusion. No trismus, tenderness, swelling, pain on movement or malocclusion.     Mouth/Throat:     Lips: Pink. No lesions.      Mouth: Mucous membranes are moist. No angioedema.     Tongue: No lesions. Tongue does not deviate from midline.     Palate: No mass and lesions.     Pharynx: Oropharynx is clear. Uvula midline. No pharyngeal swelling, oropharyngeal exudate, posterior oropharyngeal erythema or uvula swelling.     Tonsils: No tonsillar exudate or tonsillar abscesses. 1+ on the right. 1+ on the left.  Eyes:     General: No scleral icterus.       Right eye: No discharge.        Left eye: No discharge.     Extraocular Movements: Extraocular movements intact.     Pupils: Pupils are equal, round, and reactive to light.  Cardiovascular:     Rate and Rhythm: Normal rate.  Pulmonary:     Effort: Pulmonary effort is normal. No tachypnea,  bradypnea or respiratory distress.     Breath sounds: Normal breath sounds. No stridor.  Abdominal:     Palpations: Abdomen is soft.     Tenderness: There is no abdominal tenderness. There is no guarding or rebound.  Musculoskeletal:     Cervical back: Normal range of motion and neck supple. No rigidity.  Skin:    General: Skin is warm and dry.  Neurological:     General: No focal deficit present.     Mental Status: She is alert and oriented to person, place, and time.     GCS: GCS eye subscore is 4. GCS verbal subscore is 5. GCS motor subscore is 6.     Cranial Nerves: Cranial nerves 2-12 are intact. No cranial nerve deficit, dysarthria or facial asymmetry.     Sensory: Sensation is intact.     Motor: No weakness, tremor, seizure activity or pronator drift.     Coordination: Romberg sign negative. Finger-Nose-Finger Test normal.     Gait: Gait is intact. Gait normal.     Comments: CN II-XII intact, equal grip strength, +5 strength to bilateral upper and lower extremities, sensation to light touch is intact to bilateral upper and lower extremities.  Psychiatric:        Behavior: Behavior is cooperative.    ED Results / Procedures / Treatments   Labs (all labs ordered are  listed, but only abnormal results are displayed) Labs Reviewed  RESP PANEL BY RT-PCR (FLU A&B, COVID) ARPGX2    EKG None  Radiology No results found.  Procedures Procedures   Medications Ordered in ED Medications  metoCLOPramide (REGLAN) injection 10 mg (10 mg Intravenous Given 10/16/21 1457)  diphenhydrAMINE (BENADRYL) injection 50 mg (50 mg Intravenous Given 10/16/21 1457)    ED Course  I have reviewed the triage vital signs and the nursing notes.  Pertinent labs & imaging results that were available during my care of the patient were reviewed by me and considered in my medical decision making (see chart for details).    MDM Rules/Calculators/A&P                           Alert 20 year old female in no acute distress, nontoxic-appearing.  Presents to ED with chief complaint of headache, rhinorrhea, cough, and sore throat.  Headache onset was gradual and pain progressively worse over time.  No change in pain with exertion.  Patient denies any recent falls or injuries.  Neuro exam is reassuring.  Low suspicion for [facility] at this time.  We will give patient migraine cocktail and reassess.  After receiving migraine cocktail patient reports resolution of her headache.  Lungs clear to auscultation bilaterally, low suspicion for pneumonia at this time.  Patient has no tonsillar swelling, erythema, or exudate.  Low suspicion for strep pharyngitis at this time.  With patient's consolation of symptoms suspect possible viral infection.  Patient negative for COVID-19 and influenza.  Suspect viral upper respiratory infection.  Patient prescribed Tessalon and advised to use over-the-counter symptomatic medication as needed.  Patient to follow-up with primary care provider if symptoms do not improve.  Discussed results, findings, treatment and follow up. Patient advised of return precautions. Patient verbalized understanding and agreed with plan.   Final Clinical Impression(s) / ED  Diagnoses Final diagnoses:  None    Rx / DC Orders ED Discharge Orders     None        Haskel Schroeder, PA-C 10/16/21 1537  Haskel Schroeder, PA-C 10/16/21 1538    Vanetta Mulders, MD 10/21/21 [phone]

## 2021-10-16 NOTE — ED Triage Notes (Signed)
Pt arrives to ED with c/o of headache, sneezing, sore throat, cough x3 days ago on 10/24. No SOB.

## 2022-06-07 ENCOUNTER — Telehealth: Payer: Self-pay | Admitting: Family Medicine

## 2022-06-07 ENCOUNTER — Telehealth: Payer: BC Managed Care – PPO | Admitting: Family Medicine

## 2022-06-07 NOTE — Telephone Encounter (Signed)
I called the Chloe Sanders to inform her that the appt for today at 3:30 pm was cancelled, as Dr. Salomon Fick would rather see her in person. I left a message asking Chloe Sanders to call to reschedule. I also sent Chloe Sanders an email with the same information.(IC)

## 2022-08-11 ENCOUNTER — Ambulatory Visit (INDEPENDENT_AMBULATORY_CARE_PROVIDER_SITE_OTHER): Payer: BC Managed Care – PPO | Admitting: Family Medicine

## 2022-08-11 ENCOUNTER — Encounter: Payer: Self-pay | Admitting: Family Medicine

## 2022-08-11 VITALS — BP 126/80 | HR 82 | Temp 98.8°F | Wt 154.6 lb

## 2022-08-11 DIAGNOSIS — Z8659 Personal history of other mental and behavioral disorders: Secondary | ICD-10-CM

## 2022-08-11 DIAGNOSIS — F322 Major depressive disorder, single episode, severe without psychotic features: Secondary | ICD-10-CM

## 2022-08-11 DIAGNOSIS — F411 Generalized anxiety disorder: Secondary | ICD-10-CM | POA: Diagnosis not present

## 2022-08-11 MED ORDER — VENLAFAXINE HCL ER 75 MG PO CP24: 75.0000 mg | ORAL_CAPSULE | Freq: Every day | ORAL | 1 refills | Status: DC

## 2022-08-11 NOTE — Patient Instructions (Addendum)
A prescription for Keflex for was sent to your pharmacy.  This is a once a day pill.  We will have you follow-up with me next 2-4 weeks to see how things are going.  If you develop any suicidal thoughts call 911.

## 2022-08-11 NOTE — Progress Notes (Signed)
Subjective:    Patient ID: Antionette Poles, female    DOB: 06/30/2001, 21 y.o.   MRN: [id number]  Chief Complaint  Patient presents with   Anxiety    Has a lot going on in life. Any possible scenario will cause anxiety. Second guesses every thing she does and thinks.     HPI Patient was seen today for ongoing concern.  Pt states she moved to [place] for school but has not been able to enjoy it fully.  Pt noted increased anxiety and depression symptoms.  In counseling.  Notes anxiety all the time, feels it when wakes up, making it difficult to leave the house and making depression worse.  Pt endorses SI.  Wants to try medication.  States in the past zoloft and lexapro caused SI.  Pt endorses h/o eating do.  Past Medical History:  Diagnosis Date   Asthma    Chiari malformation type II (HCC)    I or II per patient   COVID-19    CSF leak    Dysautonomia (HCC)    Headache     Allergies  Allergen Reactions   Apple Juice Itching    Reaction: itchy throat per patient   Carrot [Daucus Carota] Itching    Reaction: itchy throat per patient   Lactose Intolerance (Gi) Diarrhea   Midodrine Hives   Peanut Oil Hives    ROS General: Denies fever, chills, night sweats, changes in weight, changes in appetite HEENT: Denies headaches, ear pain, changes in vision, rhinorrhea, sore throat CV: Denies CP, palpitations, SOB, orthopnea Pulm: Denies SOB, cough, wheezing GI: Denies abdominal pain, nausea, vomiting, diarrhea, constipation GU: Denies dysuria, hematuria, frequency, vaginal discharge Msk: Denies muscle cramps, joint pains Neuro: Denies weakness, numbness, tingling Skin: Denies rashes, bruising Psych: Denies depression, anxiety, hallucinations  +anxiety, depression, SI    Objective:    Blood pressure 126/80, pulse 82, temperature 98.8 F (37.1 C), temperature source Oral, weight 154 lb 9.6 oz (70.1 kg), SpO2 99 %.  Gen. Pleasant, well-nourished, in no distress, normal affect    HEENT: [place]/AT, face symmetric, conjunctiva clear, no scleral icterus, PERRLA, EOMI, nares patent without drainage Lungs: no accessory muscle use, CTAB Cardiovascular: RRR, no peripheral edema Neuro:  A&Ox3, CN II-XII intact, normal gait Skin:  Warm, no lesions/ rash   Wt Readings from Last 3 Encounters:  10/16/21 147 lb (66.7 kg) (78 %, Z= 0.76)*  07/08/21 144 lb 3.2 oz (65.4 kg) (75 %, Z= 0.69)*  05/14/21 149 lb 12.8 oz (67.9 kg) (81 %, Z= 0.89)*   * Growth percentiles are based on CDC (Girls, 2-20 Years) data.    Lab Results  Component Value Date   WBC 5.9 07/08/2021   HGB 12.6 07/08/2021   HCT 38.0 07/08/2021   PLT 200.0 07/08/2021   GLUCOSE 79 07/08/2021   CHOL 126 07/08/2021   TRIG 32.0 07/08/2021   HDL 60.90 07/08/2021   LDLCALC 59 07/08/2021   ALT 14 07/08/2021   AST 22 07/08/2021   NA 140 07/08/2021   K 3.8 07/08/2021   CL 109 07/08/2021   CREATININE 0.92 07/08/2021   BUN 12 07/08/2021   CO2 23 07/08/2021   TSH 1.61 07/08/2021   HGBA1C 5.3 07/08/2021      08/11/2022    3:11 PM 07/08/2021    1:50 PM 05/14/2021   11:19 AM  Depression screen PHQ 2/9  Decreased Interest 2 1 1   Down, Depressed, Hopeless 3 1 1   PHQ - 2 Score 5 2  2  Altered sleeping 3 2 3   Tired, decreased energy 3 1 1   Change in appetite 2 3 2   Feeling bad or failure about yourself  2 1 1   Trouble concentrating 3 2 2   Moving slowly or fidgety/restless 1 0 1  Suicidal thoughts 2 1 0  PHQ-9 Score 21 12 12   Difficult doing work/chores Extremely dIfficult Somewhat difficult Somewhat difficult      08/11/2022    3:11 PM 07/08/2021    1:51 PM 05/14/2021   11:50 AM  GAD 7 : Generalized Anxiety Score  Nervous, Anxious, on Edge 3 3 2   Control/stop worrying 3 2 2   Worry too much - different things 3 2 2   Trouble relaxing 3 2 2   Restless 3 3 1   Easily annoyed or irritable 2 1 1   Afraid - awful might happen 3 1 0  Total GAD 7 Score 20 14 10   Anxiety Difficulty Extremely difficult Somewhat  difficult Somewhat difficult     Assessment/Plan:  Depression, major, single episode, severe (HCC)  -PHQ 9 score 21 this visit -pt wishes to start medication.  Rx for Effexor XR 75 mg sent in. -will place referral to [facility]  -given strict precautions - Plan: venlafaxine XR (EFFEXOR XR) 75 MG 24 hr capsule, referral to psychiartry  GAD (generalized anxiety disorder) -GAD7 score 20 -continue counseling -pt wishes to start medication.  Effexor XR 75 mg sent to pharmacy  - Plan: venlafaxine XR (EFFEXOR XR) 75 MG 24 hr capsule, referral to psychiatry  Hx of eating disorder -h/o decreased appetite, emesis.  Did not want to know wt this visit. -Plan: referral to [facility]  F/u in 3-4 wks  , MD

## 2023-02-02 ENCOUNTER — Encounter: Payer: Self-pay | Admitting: Family Medicine

## 2023-02-02 ENCOUNTER — Telehealth (INDEPENDENT_AMBULATORY_CARE_PROVIDER_SITE_OTHER): Payer: BC Managed Care – PPO | Admitting: Family Medicine

## 2023-02-02 VITALS — Ht 64.0 in

## 2023-02-02 DIAGNOSIS — F411 Generalized anxiety disorder: Secondary | ICD-10-CM

## 2023-02-02 DIAGNOSIS — F322 Major depressive disorder, single episode, severe without psychotic features: Secondary | ICD-10-CM | POA: Diagnosis not present

## 2023-02-02 MED ORDER — HYDROXYZINE HCL 10 MG PO TABS: 10.0000 mg | ORAL_TABLET | Freq: Three times a day (TID) | ORAL | 0 refills | Status: DC | PRN

## 2023-02-02 NOTE — Progress Notes (Signed)
Virtual Visit via Video Note  I connected with Chloe Sanders on 02/02/23 at  8:30 AM EST by a video enabled telemedicine application 2/2 [id number] pandemic and verified that I am speaking with the correct person using two identifiers.  Location patient: home Location provider:work or home office Persons participating in the virtual visit: patient, provider  I discussed the limitations of evaluation and management by telemedicine and the availability of in person appointments. The patient expressed understanding and agreed to proceed.  Chief Complaint  Patient presents with   Medication Management    HPI: Patient is a 22 year old female with pmh sig for migraines, anxiety, depression, eating disorder, dystonia, Chiari malformation type I who was seen for ongoing concern.  Patient with worsening anxiety/depression symptoms.  States Effexor made her feel worse. Was in an outpt facility in [place], given Rx for hydroxyzine and Abilify.  Was afraid to start Abilify.  Hydroxyzine worked well, but out of x 2 months.  Mood isn't great.  Sleep is so so.  Patient tearful.  Endorses recent break-up which is making things worse.  States not as close to her family anymore and her friends are busy today.  Pt seeing a counselor, last session was 2 wks ago.  Pt was trying to make a psychiatrist appointment but it will not be until next wk.  ROS: See pertinent positives and negatives per HPI.  Past Medical History:  Diagnosis Date   Asthma    Chiari malformation type II ([place])    I or II per patient   COVID-19    CSF leak    Dysautonomia ([place])    Headache     Past Surgical History:  Procedure Laterality Date   Chiari Decompression  05/2016    Family History  Problem Relation Age of Onset   Hypertension Mother    Cancer Father    Hypertension Father    High blood pressure Brother     Current Outpatient Medications:    acetaminophen (TYLENOL) 325 MG tablet, Take 650-975 mg by mouth as  needed for fever, mild pain or headache., Disp: , Rfl:    benzonatate (TESSALON) 100 MG capsule, Take 1 capsule (100 mg total) by mouth every 8 (eight) hours., Disp: 21 capsule, Rfl: 0   erythromycin ophthalmic ointment, Place a 1/2 inch ribbon of ointment into the lower eyelid., Disp: 3.5 g, Rfl: 0   ibuprofen (ADVIL) 200 MG tablet, Take 400-600 mg by mouth every 6 (six) hours as needed for headache, fever or mild pain., Disp: , Rfl:    lidocaine (LIDODERM) 5 %, Place 1 patch onto the skin daily. Remove & Discard patch within 12 hours or as directed by MD, Disp: 30 patch, Rfl: 0   methocarbamol (ROBAXIN) 500 MG tablet, Take 1 tablet (500 mg total) by mouth 2 (two) times daily., Disp: 20 tablet, Rfl: 0   venlafaxine XR (EFFEXOR XR) 75 MG 24 hr capsule, Take 1 capsule (75 mg total) by mouth daily with breakfast., Disp: 30 capsule, Rfl: 1  EXAM:  VITALS per patient if applicable: RR between 123456 bpm  GENERAL: alert, oriented, appears in mild distress, tearful.  HEENT: atraumatic, conjunctiva clear, no obvious abnormalities on inspection of external nose and ears  NECK: normal movements of the head and neck  LUNGS: on inspection no signs of respiratory distress, breathing rate appears normal, no obvious gross SOB, gasping or wheezing  CV: no obvious cyanosis  MS: moves all visible extremities without noticeable abnormality  PSYCH/NEURO: pleasant and  cooperative, no obvious depression or anxiety, speech and thought processing grossly intact     02/02/2023    8:51 AM 08/11/2022    3:11 PM 07/08/2021    1:51 PM 05/14/2021   11:50 AM  GAD 7 : Generalized Anxiety Score  Nervous, Anxious, on Edge 3 3 3 2  $ Control/stop worrying 3 3 2 2  $ Worry too much - different things 3 3 2 2  $ Trouble relaxing 3 3 2 2  $ Restless 2 3 3 1  $ Easily annoyed or irritable 0 2 1 1  $ Afraid - awful might happen 3 3 1 $ 0  Total GAD 7 Score 17 20 14 10  $ Anxiety Difficulty  Extremely difficult Somewhat difficult  Somewhat difficult      02/02/2023    8:49 AM 08/11/2022    3:11 PM 07/08/2021    1:50 PM  Depression screen PHQ 2/9  Decreased Interest 2 2 1  $ Down, Depressed, Hopeless 3 3 1  $ PHQ - 2 Score 5 5 2  $ Altered sleeping 3 3 2  $ Tired, decreased energy 2 3 1  $ Change in appetite 2 2 3  $ Feeling bad or failure about yourself  3 2 1  $ Trouble concentrating 3 3 2  $ Moving slowly or fidgety/restless 1 1 0  Suicidal thoughts 2 2 1  $ PHQ-9 Score 21 21 12  $ Difficult doing work/chores  Extremely dIfficult Somewhat difficult      ASSESSMENT AND PLAN:  Discussed the following assessment and plan:  GAD (generalized anxiety disorder) - Plan: hydrOXYzine (ATARAX) 10 MG tablet  Depression, major, single episode, severe (HCC)  Pt with continued anxiety and depression symptoms.  PHQ-9 score 21 this visit, GAD-7 score 17 this visit.  Effexor d/c'd.  patient encouraged to start Abilify.  Rx for hydroxyzine 10 mg 3 times daily as needed sent to local pharmacy.  Patient encouraged to schedule and keep appointment with psychiatrist.  Discussed emergency care options including proceeding to the nearest emergency department.  Will have patient follow-up later this week/early next week.   I discussed the assessment and treatment plan with the patient. The patient was provided an opportunity to ask questions and all were answered. The patient agreed with the plan and demonstrated an understanding of the instructions.   The patient was advised to call back or seek an in-person evaluation if the symptoms worsen or if the condition fails to improve as anticipated.  I provided 16 minutes of non-face-to-face time during this encounter.   Billie Ruddy, MD

## 2023-03-18 ENCOUNTER — Other Ambulatory Visit: Payer: Self-pay | Admitting: Family Medicine

## 2023-03-18 DIAGNOSIS — F411 Generalized anxiety disorder: Secondary | ICD-10-CM

## 2023-03-21 ENCOUNTER — Encounter: Payer: Self-pay | Admitting: Family Medicine

## 2023-03-21 ENCOUNTER — Telehealth (INDEPENDENT_AMBULATORY_CARE_PROVIDER_SITE_OTHER): Payer: BC Managed Care – PPO | Admitting: Family Medicine

## 2023-03-21 VITALS — Ht 64.0 in | Wt 150.0 lb

## 2023-03-21 DIAGNOSIS — F411 Generalized anxiety disorder: Secondary | ICD-10-CM

## 2023-03-21 DIAGNOSIS — F322 Major depressive disorder, single episode, severe without psychotic features: Secondary | ICD-10-CM

## 2023-03-21 MED ORDER — ARIPIPRAZOLE 2 MG PO TABS
2.0000 mg | ORAL_TABLET | Freq: Every day | ORAL | 1 refills | Status: DC
Start: 2023-03-21 — End: 2023-07-06

## 2023-03-21 MED ORDER — HYDROXYZINE HCL 25 MG PO TABS
ORAL_TABLET | ORAL | 0 refills | Status: DC
Start: 2023-03-21 — End: 2023-07-06

## 2023-03-21 NOTE — Progress Notes (Signed)
Virtual Visit via Video Note  I connected with Chloe Sanders on 03/21/23 at 11:30 AM EDT by a video enabled telemedicine application and verified that I am speaking with the correct person using two identifiers.  Location patient: home Location provider:work or home office Persons participating in the virtual visit: patient, provider  I discussed the limitations of evaluation and management by telemedicine and the availability of in person appointments. The patient expressed understanding and agreed to proceed.  Chief Complaint  Patient presents with   Medication Refill   Follow-up    HPI: Pt is a 22 yo female with pmh sig for migraines, anxiety, depression, eating d/o, Chiari Malformation type 1, dystonia, who was seen for f/u.  Pt last seen 02/02/23 for anxiety and depression.  Hydroxyzine was refilled.  Panic attacks are increasing.  Hydroxyzine 10 mg no longer working.  Taking it TID.  Sleep is not good.  Did not sleep last night.  Appetite is not that good.  Energy is "pretty low".  Not taking Abilify consistently.  Was only given 10 tabs when seen at out pt center, [facility], in [place]. Has 3 or 4 left.  Planned to start over the wknd.  Called a psychiatry office about an appt, but stated she was interested in testing, which they did not offer.  ROS: See pertinent positives and negatives per HPI.  Past Medical History:  Diagnosis Date   Asthma    Chiari malformation type II    I or II per patient   COVID-19    CSF leak    Dysautonomia    Headache     Past Surgical History:  Procedure Laterality Date   Chiari Decompression  05/2016    Family History  Problem Relation Age of Onset   Hypertension Mother    Cancer Father    Hypertension Father    High blood pressure Brother     Current Outpatient Medications:    hydrOXYzine (ATARAX) 10 MG tablet, Take 1 tablet (10 mg total) by mouth 3 (three) times daily as needed for anxiety., Disp: 90 tablet, Rfl: 0   acetaminophen  (TYLENOL) 325 MG tablet, Take 650-975 mg by mouth as needed for fever, mild pain or headache. (Patient not taking: Reported on 03/21/2023), Disp: , Rfl:    ibuprofen (ADVIL) 200 MG tablet, Take 400-600 mg by mouth every 6 (six) hours as needed for headache, fever or mild pain. (Patient not taking: Reported on 03/21/2023), Disp: , Rfl:    lidocaine (LIDODERM) 5 %, Place 1 patch onto the skin daily. Remove & Discard patch within 12 hours or as directed by MD (Patient not taking: Reported on 03/21/2023), Disp: 30 patch, Rfl: 0   methocarbamol (ROBAXIN) 500 MG tablet, Take 1 tablet (500 mg total) by mouth 2 (two) times daily. (Patient not taking: Reported on 03/21/2023), Disp: 20 tablet, Rfl: 0  EXAM:  VITALS per patient if applicable:  RR between 12-20 bpm  GENERAL: alert, oriented, tearful , nontoxic.  HEENT: atraumatic, conjunctiva clear, no obvious abnormalities on inspection of external nose and ears  NECK: normal movements of the head and neck  LUNGS: on inspection no signs of respiratory distress, breathing rate appears normal, no obvious gross SOB, gasping or wheezing  CV: no obvious cyanosis  MS: moves all visible extremities without noticeable abnormality  PSYCH/NEURO: pleasant and cooperative, no obvious depression or anxiety, speech and thought processing grossly intact     03/21/2023   11:40 AM 02/02/2023    8:51 AM  08/11/2022    3:11 PM 07/08/2021    1:51 PM  GAD 7 : Generalized Anxiety Score  Nervous, Anxious, on Edge 3 3 3 3   Control/stop worrying 3 3 3 2   Worry too much - different things 3 3 3 2   Trouble relaxing 3 3 3 2   Restless 3 2 3 3   Easily annoyed or irritable 1 0 2 1  Afraid - awful might happen 3 3 3 1   Total GAD 7 Score 19 17 20 14   Anxiety Difficulty Very difficult  Extremely difficult Somewhat difficult       03/21/2023   11:38 AM 02/02/2023    8:49 AM 08/11/2022    3:11 PM  Depression screen PHQ 2/9  Decreased Interest 3 2 2   Down, Depressed, Hopeless 3 3 3    PHQ - 2 Score 6 5 5   Altered sleeping 3 3 3   Tired, decreased energy 3 2 3   Change in appetite 3 2 2   Feeling bad or failure about yourself  3 3 2   Trouble concentrating 3 3 3   Moving slowly or fidgety/restless 0 1 1  Suicidal thoughts 1 2 2   PHQ-9 Score 22 21 21   Difficult doing work/chores Very difficult  Extremely dIfficult    ASSESSMENT AND PLAN:  Discussed the following assessment and plan:  GAD (generalized anxiety disorder) - Plan: ARIPiprazole (ABILIFY) 2 MG tablet, hydrOXYzine (ATARAX) 25 MG tablet  Depression, major, single episode, severe - Plan: ARIPiprazole (ABILIFY) 2 MG tablet  PHQ-9 score 22 and GAD-7 score 19.Marland Kitchen  Discussed the importance of taking medications consistently.  Will increase dose of hydroxyzine, patient can take 25-50 mg 3 times daily as needed.  Patient to start Abilify.  Given  resources in the [place] area including [facility], [facility], and [facility].  For continued or worsening symptoms advised to proceed to nearest ED.  Will have patient follow-up in one 1 week, sooner if needed.    I discussed the assessment and treatment plan with the patient. The patient was provided an opportunity to ask questions and all were answered. The patient agreed with the plan and demonstrated an understanding of the instructions.   The patient was advised to call back or seek an in-person evaluation if the symptoms worsen or if the condition fails to improve as anticipated.   Billie Ruddy, MD

## 2023-05-02 DIAGNOSIS — F332 Major depressive disorder, recurrent severe without psychotic features: Secondary | ICD-10-CM | POA: Insufficient documentation

## 2023-05-26 ENCOUNTER — Telehealth: Payer: BC Managed Care – PPO | Admitting: Family Medicine

## 2023-05-27 ENCOUNTER — Telehealth: Payer: BC Managed Care – PPO | Admitting: Family Medicine

## 2023-07-06 ENCOUNTER — Telehealth: Payer: BC Managed Care – PPO | Admitting: Family Medicine

## 2023-07-06 ENCOUNTER — Encounter: Payer: Self-pay | Admitting: Family Medicine

## 2023-07-06 DIAGNOSIS — F322 Major depressive disorder, single episode, severe without psychotic features: Secondary | ICD-10-CM

## 2023-07-06 DIAGNOSIS — F411 Generalized anxiety disorder: Secondary | ICD-10-CM | POA: Diagnosis not present

## 2023-07-06 DIAGNOSIS — F900 Attention-deficit hyperactivity disorder, predominantly inattentive type: Secondary | ICD-10-CM | POA: Diagnosis not present

## 2023-07-06 MED ORDER — AMPHETAMINE-DEXTROAMPHET ER 20 MG PO CP24
20.0000 mg | ORAL_CAPSULE | ORAL | 0 refills | Status: AC
Start: 2023-07-06 — End: ?

## 2023-07-06 MED ORDER — HYDROXYZINE HCL 25 MG PO TABS
ORAL_TABLET | ORAL | 0 refills | Status: DC
Start: 2023-07-06 — End: 2024-02-23

## 2023-07-06 NOTE — Progress Notes (Signed)
Virtual Visit via Video Note  I connected with Chloe Sanders on 07/06/23 at  4:15 PM EDT by a video enabled telemedicine application and verified that I am speaking with the correct person using two identifiers.  Location patient: home Location provider:work or home office Persons participating in the virtual visit: patient, provider  I discussed the limitations of evaluation and management by telemedicine and the availability of in person appointments. The patient expressed understanding and agreed to proceed. Chief Complaint  Patient presents with   Medication Refill    HPI: Patient is a 22 year old female seen for follow-up on ongoing concerns and med refills.  Patient recently seen by psychiatry, Blondell Reveal?  Started on Adderall XR 20 mg daily for ADHD and told to stop Abilify and Wellbutrin.  Patient states Wellbutrin made anxiety worse and caused wt gain 10 lbs in a few weeks.  Requesting refill on Hydroxyzine which helps a lot with anxiety and adderall xr 20 mg.  Patient states insurance initially cover with [facility] visit then stopped.  Patient states she cannot afford to keep going if having to pay out-of-pocket.  Energy is all over the place.  Sleep is a little difficult only because she is feeling the withdrawal from Wellbutrin.  Patient states she is feeling somewhat better since stopping the medication.   ROS: See pertinent positives and negatives per HPI.  Past Medical History:  Diagnosis Date   Asthma    Chiari malformation type II (HCC)    I or II per patient   COVID-19    CSF leak    Dysautonomia (HCC)    Headache     Past Surgical History:  Procedure Laterality Date   Chiari Decompression  05/2016    Family History  Problem Relation Age of Onset   Hypertension Mother    Cancer Father    Hypertension Father    High blood pressure Brother        Current Outpatient Medications:    acetaminophen (TYLENOL) 325 MG tablet, Take 650-975 mg by mouth as needed  for fever, mild pain or headache., Disp: , Rfl:    amphetamine-dextroamphetamine (ADDERALL XR) 20 MG 24 hr capsule, Take 20 mg by mouth every morning., Disp: , Rfl:    hydrOXYzine (ATARAX) 25 MG tablet, Take 1-2 tabs (25-50 mg) three times per day as needed for anxiety., Disp: 90 tablet, Rfl: 0   ARIPiprazole (ABILIFY) 2 MG tablet, Take 1 tablet (2 mg total) by mouth daily. (Patient not taking: Reported on 07/06/2023), Disp: 30 tablet, Rfl: 1   ibuprofen (ADVIL) 200 MG tablet, Take 400-600 mg by mouth every 6 (six) hours as needed for headache, fever or mild pain. (Patient not taking: Reported on 07/06/2023), Disp: , Rfl:    lidocaine (LIDODERM) 5 %, Place 1 patch onto the skin daily. Remove & Discard patch within 12 hours or as directed by MD (Patient not taking: Reported on 07/06/2023), Disp: 30 patch, Rfl: 0   methocarbamol (ROBAXIN) 500 MG tablet, Take 1 tablet (500 mg total) by mouth 2 (two) times daily. (Patient not taking: Reported on 07/06/2023), Disp: 20 tablet, Rfl: 0  EXAM:  VITALS per patient if applicable:  RR between 12-20 bpm  GENERAL: alert, oriented, appears well and in no acute distress  HEENT: atraumatic, conjunctiva clear, no obvious abnormalities on inspection of external nose and ears  NECK: normal movements of the head and neck  LUNGS: on inspection no signs of respiratory distress, breathing rate appears normal, no obvious gross  SOB, gasping or wheezing  CV: no obvious cyanosis  MS: moves all visible extremities without noticeable abnormality  PSYCH/NEURO: pleasant and cooperative, no obvious depression or anxiety, speech and thought processing grossly intact    07/06/2023    4:18 PM 03/21/2023   11:38 AM 02/02/2023    8:49 AM  Depression screen PHQ 2/9  Decreased Interest 3 3 2   Down, Depressed, Hopeless 3 3 3   PHQ - 2 Score 6 6 5   Altered sleeping 3 3 3   Tired, decreased energy 3 3 2   Change in appetite 2 3 2   Feeling bad or failure about yourself  3 3 3    Trouble concentrating 3 3 3   Moving slowly or fidgety/restless 2 0 1  Suicidal thoughts 3 1 2   PHQ-9 Score 25 22 21   Difficult doing work/chores Extremely dIfficult Very difficult       03/21/2023   11:40 AM 02/02/2023    8:51 AM 08/11/2022    3:11 PM 07/08/2021    1:51 PM  GAD 7 : Generalized Anxiety Score  Nervous, Anxious, on Edge 3 3 3 3   Control/stop worrying 3 3 3 2   Worry too much - different things 3 3 3 2   Trouble relaxing 3 3 3 2   Restless 3 2 3 3   Easily annoyed or irritable 1 0 2 1  Afraid - awful might happen 3 3 3 1   Total GAD 7 Score 19 17 20 14   Anxiety Difficulty Very difficult  Extremely difficult Somewhat difficult      ASSESSMENT AND PLAN:  Discussed the following assessment and plan:  GAD (generalized anxiety disorder) - Plan: hydrOXYzine (ATARAX) 25 MG tablet  Attention deficit hyperactivity disorder (ADHD), predominantly inattentive type - Plan: amphetamine-dextroamphetamine (ADDERALL XR) 20 MG 24 hr capsule  Depression, major, single episode, severe (HCC)  Continued anxiety and depression.  PHQ-9 score 25 and GAD-7 score 19 this visit.  Wellbutrin and Abilify stopped by [facility].  Continue Adderall XR 20 mg daily and hydroxyzine 25 mg to 50 mg 3 times daily as needed.  PDMP reviewed.  Patient advised to look on insurance company website for preferred [facility] providers in her area and schedule an appointment.  Given strict precautions.  Follow-up in 4-6 weeks.   I discussed the assessment and treatment plan with the patient. The patient was provided an opportunity to ask questions and all were answered. The patient agreed with the plan and demonstrated an understanding of the instructions.   The patient was advised to call back or seek an in-person evaluation if the symptoms worsen or if the condition fails to improve as anticipated.   Deeann Saint, MD

## 2023-09-20 DIAGNOSIS — R519 Headache, unspecified: Secondary | ICD-10-CM | POA: Insufficient documentation

## 2023-09-21 ENCOUNTER — Telehealth (INDEPENDENT_AMBULATORY_CARE_PROVIDER_SITE_OTHER): Payer: BC Managed Care – PPO | Admitting: Family Medicine

## 2023-09-21 ENCOUNTER — Encounter: Payer: Self-pay | Admitting: Family Medicine

## 2023-09-21 DIAGNOSIS — F900 Attention-deficit hyperactivity disorder, predominantly inattentive type: Secondary | ICD-10-CM

## 2023-09-21 DIAGNOSIS — M542 Cervicalgia: Secondary | ICD-10-CM

## 2023-09-21 DIAGNOSIS — M545 Low back pain, unspecified: Secondary | ICD-10-CM

## 2023-09-21 DIAGNOSIS — G935 Compression of brain: Secondary | ICD-10-CM | POA: Diagnosis not present

## 2023-09-21 DIAGNOSIS — G8929 Other chronic pain: Secondary | ICD-10-CM

## 2023-09-21 DIAGNOSIS — M5410 Radiculopathy, site unspecified: Secondary | ICD-10-CM | POA: Diagnosis not present

## 2023-09-21 DIAGNOSIS — F332 Major depressive disorder, recurrent severe without psychotic features: Secondary | ICD-10-CM | POA: Diagnosis not present

## 2023-09-21 DIAGNOSIS — F411 Generalized anxiety disorder: Secondary | ICD-10-CM | POA: Diagnosis not present

## 2023-09-21 MED ORDER — AMPHETAMINE-DEXTROAMPHET ER 20 MG PO CP24
20.0000 mg | ORAL_CAPSULE | Freq: Every morning | ORAL | 0 refills | Status: DC
Start: 2023-10-21 — End: 2024-02-16

## 2023-09-21 MED ORDER — AMPHETAMINE-DEXTROAMPHET ER 20 MG PO CP24
20.0000 mg | ORAL_CAPSULE | Freq: Every morning | ORAL | 0 refills | Status: DC
Start: 2023-11-20 — End: 2024-02-16

## 2023-09-21 MED ORDER — AMPHETAMINE-DEXTROAMPHET ER 20 MG PO CP24
20.0000 mg | ORAL_CAPSULE | ORAL | 0 refills | Status: DC
Start: 2023-09-21 — End: 2023-09-28

## 2023-09-21 NOTE — Progress Notes (Signed)
"  Patient was unable to self-report due to a lack of equipment at home via telehealth"

## 2023-09-21 NOTE — Progress Notes (Signed)
Virtual Visit via Video Note  I connected with Chloe Sanders on 09/21/23 at  1:30 PM EDT by a video enabled telemedicine application and verified that I am speaking with the correct person using two identifiers.  Location patient: home Location provider:work or home office Persons participating in the virtual visit: patient, provider  I discussed the limitations of evaluation and management by telemedicine and the availability of in person appointments. The patient expressed understanding and agreed to proceed.   HPI: Pt is a 22 yo female with a history of depression and anxiety, ADHD, Chiari malformation seen for f/u on chronic conditions.   Requesting refill on Adderall XR 20 mg.  States able to focus in class and get work done, and read through things since being on med.  States depression has been all over the place.  Pt does not want to restart medications.  Has an appointment to start EMDR next Monday with [facility] in [place].  Cannot recall the name of the practice but states it has holistic and the name.  Pt waking up throughout the night.  States back and neck are hurting when laying down or standing up for extended periods.  Pain shoots down legs.   A few months ago, hands started going numb again.  Starting to drop things.  Having tightness in posterior neck and low back.  In the past got regular massages, acupuncture, and PT. previously seen by neurology and required regular spinal taps.  States starting to feel like she may need one again.  S/p surgery as a child for Chiari malformation.   ROS: See pertinent positives and negatives per HPI.  Past Medical History:  Diagnosis Date   Asthma    Chiari malformation type II (HCC)    I or II per patient   COVID-19    CSF leak    Dysautonomia (HCC)    Headache     Past Surgical History:  Procedure Laterality Date   Chiari Decompression  05/2016    Family History  Problem Relation Age of Onset   Hypertension  Mother    Cancer Father    Hypertension Father    High blood pressure Brother      Current Outpatient Medications:    acetaminophen (TYLENOL) 325 MG tablet, Take 650-975 mg by mouth as needed for fever, mild pain or headache., Disp: , Rfl:    amphetamine-dextroamphetamine (ADDERALL XR) 20 MG 24 hr capsule, Take 20 mg by mouth every morning., Disp: , Rfl:    hydrOXYzine (ATARAX) 25 MG tablet, Take 1-2 tabs (25-50 mg) three times per day as needed for anxiety., Disp: 90 tablet, Rfl: 0   ibuprofen (ADVIL) 800 MG tablet, Take 800 mg by mouth every 8 (eight) hours as needed., Disp: , Rfl:    omeprazole (PRILOSEC) 20 MG capsule, Take by mouth., Disp: , Rfl:    amphetamine-dextroamphetamine (ADDERALL XR) 20 MG 24 hr capsule, Take 1 capsule (20 mg total) by mouth every morning., Disp: 30 capsule, Rfl: 0   buPROPion (WELLBUTRIN XL) 300 MG 24 hr tablet, Take 300 mg by mouth daily. (Patient not taking: Reported on 09/21/2023), Disp: , Rfl:    spironolactone (ALDACTONE) 25 MG tablet, Take 1 tablet by mouth 2 (two) times daily. (Patient not taking: Reported on 09/21/2023), Disp: , Rfl:    traMADol (ULTRAM) 50 MG tablet, Take 50 mg by mouth every 4 (four) hours as needed. (Patient not taking: Reported on 09/21/2023), Disp: , Rfl:    zonisamide (ZONEGRAN) 50  MG capsule, Take by mouth. (Patient not taking: Reported on 09/21/2023), Disp: , Rfl:   EXAM:  VITALS per patient if applicable: RR between 12-20 bpm.  GENERAL: alert, oriented, appears well and in no acute distress  HEENT: atraumatic, conjunctiva clear, no obvious abnormalities on inspection of external nose and ears  NECK: normal movements of the head and neck  LUNGS: on inspection no signs of respiratory distress, breathing rate appears normal, no obvious gross SOB, gasping or wheezing  CV: no obvious cyanosis  MS: moves all visible extremities without noticeable abnormality  PSYCH/NEURO: pleasant and cooperative, no obvious depression or  anxiety, speech and thought processing grossly intact     09/21/2023    1:37 PM 09/21/2023    1:36 PM 07/06/2023    4:18 PM  Depression screen PHQ 2/9  Decreased Interest  3 3  Down, Depressed, Hopeless 3 3 3   PHQ - 2 Score 3 6 6   Altered sleeping 3  3  Tired, decreased energy 3  3  Change in appetite 2  2  Feeling bad or failure about yourself  3  3  Trouble concentrating 3  3  Moving slowly or fidgety/restless 0  2  Suicidal thoughts 2  3  PHQ-9 Score 19  25  Difficult doing work/chores   Extremely dIfficult      09/21/2023    1:37 PM 03/21/2023   11:40 AM 02/02/2023    8:51 AM 08/11/2022    3:11 PM  GAD 7 : Generalized Anxiety Score  Nervous, Anxious, on Edge 3 3 3 3   Control/stop worrying 3 3 3 3   Worry too much - different things 3 3 3 3   Trouble relaxing 3 3 3 3   Restless 3 3 2 3   Easily annoyed or irritable 3 1 0 2  Afraid - awful might happen 3 3 3 3   Total GAD 7 Score 21 19 17 20   Anxiety Difficulty Extremely difficult Very difficult  Extremely difficult     ASSESSMENT AND PLAN:  Discussed the following assessment and plan:  Attention deficit hyperactivity disorder (ADHD), predominantly inattentive type  -Stable -Continue Adderall XR 20 mg daily.  Prescriptions for the next 3 months sent to pharmacy. -PDMP reviewed and appropriate. - Plan: amphetamine-dextroamphetamine (ADDERALL XR) 20 MG 24 hr capsule, amphetamine-dextroamphetamine (ADDERALL XR) 20 MG 24 hr capsule, amphetamine-dextroamphetamine (ADDERALL XR) 20 MG 24 hr capsule  Severe episode of recurrent major depressive disorder, without psychotic features (HCC) -PHQ-9 score 19 -Patient declines to restart medications at this time. -Patient encouraged to continue counseling in [place].  To start EMDR next week. -Also advised to look into finding a psychiatrist in the [place] area as would be better in the case of emergencies.  GAD (generalized anxiety disorder) -GAD-7 score 21  Chiari malformation  type I (HCC) -Status post surgery -Conflicting details Chiari malformation 1 versus 2 as both listed in chart upon review. - Plan: Ambulatory referral to Neurology, Ambulatory referral to Physical Therapy  Neck pain -Possibly related to history of Chiari malformation - Plan: Ambulatory referral to Neurology, Ambulatory referral to Physical Therapy  Chronic bilateral low back pain without sciatica -Possibly related to history of Chiari malformation  - Plan: Ambulatory referral to Neurology, Ambulatory referral to Physical Therapy  Radiculopathy, unspecified spinal region  - Plan: Ambulatory referral to Neurology, Ambulatory referral to Physical Therapy  Given strict precautions.  Follow-up in the next 2-3 months, sooner needed.   I discussed the assessment and treatment plan with the patient.  The patient was provided an opportunity to ask questions and all were answered. The patient agreed with the plan and demonstrated an understanding of the instructions.   The patient was advised to call back or seek an in-person evaluation if the symptoms worsen or if the condition fails to improve as anticipated.   Deeann Saint, MD

## 2023-09-26 ENCOUNTER — Telehealth: Payer: Self-pay

## 2023-09-26 NOTE — Telephone Encounter (Signed)
Ok

## 2023-09-27 DIAGNOSIS — R519 Headache, unspecified: Secondary | ICD-10-CM | POA: Diagnosis not present

## 2023-09-27 DIAGNOSIS — Z3202 Encounter for pregnancy test, result negative: Secondary | ICD-10-CM | POA: Diagnosis not present

## 2023-09-27 DIAGNOSIS — Z9889 Other specified postprocedural states: Secondary | ICD-10-CM | POA: Diagnosis not present

## 2023-09-27 NOTE — Telephone Encounter (Signed)
Calling to ask that this be routed to  [facility] [place], [place] - [address] Phone: [phone]  Fax: [phone]     They have 10 and 20mg 

## 2023-09-28 ENCOUNTER — Other Ambulatory Visit: Payer: Self-pay | Admitting: Family Medicine

## 2023-09-28 DIAGNOSIS — F900 Attention-deficit hyperactivity disorder, predominantly inattentive type: Secondary | ICD-10-CM

## 2023-09-28 MED ORDER — AMPHETAMINE-DEXTROAMPHET ER 20 MG PO CP24
20.0000 mg | ORAL_CAPSULE | ORAL | 0 refills | Status: DC
Start: 2023-09-28 — End: 2024-02-16

## 2023-09-28 NOTE — Telephone Encounter (Signed)
This months refill of Adderall sent to the [facility] pharmacy.  The 2 rx's for next month are still at pt's original pharmacy.

## 2023-09-28 NOTE — Telephone Encounter (Signed)
Patient is aware 

## 2023-10-14 DIAGNOSIS — K0889 Other specified disorders of teeth and supporting structures: Secondary | ICD-10-CM | POA: Diagnosis not present

## 2023-10-24 DIAGNOSIS — R531 Weakness: Secondary | ICD-10-CM | POA: Diagnosis not present

## 2023-10-24 DIAGNOSIS — F419 Anxiety disorder, unspecified: Secondary | ICD-10-CM | POA: Diagnosis not present

## 2023-10-24 DIAGNOSIS — H532 Diplopia: Secondary | ICD-10-CM | POA: Diagnosis not present

## 2023-10-24 DIAGNOSIS — Z716 Tobacco abuse counseling: Secondary | ICD-10-CM | POA: Diagnosis not present

## 2023-10-24 DIAGNOSIS — R519 Headache, unspecified: Secondary | ICD-10-CM | POA: Diagnosis not present

## 2023-10-24 DIAGNOSIS — F32A Depression, unspecified: Secondary | ICD-10-CM | POA: Diagnosis not present

## 2023-10-24 DIAGNOSIS — G935 Compression of brain: Secondary | ICD-10-CM | POA: Diagnosis not present

## 2023-10-24 DIAGNOSIS — F909 Attention-deficit hyperactivity disorder, unspecified type: Secondary | ICD-10-CM | POA: Diagnosis not present

## 2023-10-25 DIAGNOSIS — Z8669 Personal history of other diseases of the nervous system and sense organs: Secondary | ICD-10-CM | POA: Diagnosis not present

## 2023-10-25 DIAGNOSIS — R779 Abnormality of plasma protein, unspecified: Secondary | ICD-10-CM | POA: Diagnosis not present

## 2023-10-25 DIAGNOSIS — R29818 Other symptoms and signs involving the nervous system: Secondary | ICD-10-CM | POA: Diagnosis not present

## 2023-10-27 ENCOUNTER — Telehealth: Payer: Self-pay

## 2023-10-27 NOTE — Transitions of Care (Post Inpatient/ED Visit) (Signed)
   10/27/2023  Name: Chloe Sanders MRN: [id number] DOB: 12-12-2001  Today's TOC FU Call Status: Today's TOC FU Call Status:: Unsuccessful Call (1st Attempt) Unsuccessful Call (1st Attempt) Date: 10/27/23  Attempted to reach the patient regarding the most recent Inpatient/ED visit.  Follow Up Plan: Additional outreach attempts will be made to reach the patient to complete the Transitions of Care (Post Inpatient/ED visit) call.     Antionette Fairy, RN,BSN,CCM RN Care Manager Transitions of Care  [facility]  Direct Phone: [phone] Toll Free: [phone] Fax: [phone]

## 2023-10-28 ENCOUNTER — Telehealth: Payer: Self-pay

## 2023-10-28 NOTE — Transitions of Care (Post Inpatient/ED Visit) (Signed)
   10/28/2023  Name: Chloe Sanders MRN: [id number] DOB: Jun 08, 2001  Today's TOC FU Call Status: Today's TOC FU Call Status:: Unsuccessful Call (2nd Attempt) Unsuccessful Call (2nd Attempt) Date: 10/28/23  Attempted to reach the patient regarding the most recent Inpatient/ED visit.  Follow Up Plan: Additional outreach attempts will be made to reach the patient to complete the Transitions of Care (Post Inpatient/ED visit) call.   Lonia Chimera, RN, BSN, CEN [employer]- Transition of Care Team.  [facility] [phone]

## 2023-10-31 ENCOUNTER — Telehealth: Payer: Self-pay

## 2023-10-31 NOTE — Transitions of Care (Post Inpatient/ED Visit) (Signed)
10/31/2023  Name: Chloe Sanders MRN: [id number] DOB: [birth year]  Today's TOC FU Call Status: Today's TOC FU Call Status:: Successful TOC FU Call Completed TOC FU Call Complete Date: 10/31/23 Patient's Name and Date of Birth confirmed.  Transition Care Management Follow-up Telephone Call Date of Discharge: 10/26/23 Discharge Facility: Other [facility]) Name of Other (Non[facility]) Discharge Facility: [facility] Type of Discharge: Inpatient Admission Primary Inpatient Discharge Diagnosis:: "left sided weakness" How have you been since you were released from the hospital?: Same (Spoke briefly with pt as she is headed to [employer] she still has some back pain-taking Oxycodone, getting outpt therapy-"helping some") Any questions or concerns?: No  Items Reviewed: Did you receive and understand the discharge instructions provided?: Yes Medications obtained,verified, and reconciled?: Partial Review Completed Reason for Partial Mediation Review: pt had to end call to get to work Any new allergies since your discharge?: No Dietary orders reviewed?: Yes Type of Diet Ordered:: regular Do you have support at home?: Yes People in Home: parent(s)  Medications Reviewed Today: Medications Reviewed Today     Reviewed by Charlyn Minerva, RN (Registered Nurse) on 10/31/23 at [phone]  Med List Status: <None>   Medication Order Taking? Sig Documenting Provider Last Dose Status Informant  acetaminophen (TYLENOL) 325 MG tablet [id number]  Take 650-975 mg by mouth as needed for fever, mild pain or headache. [provider]  Active Self  amphetamine-dextroamphetamine (ADDERALL XR) 20 MG 24 hr capsule [id number]  Take 1 capsule (20 mg total) by mouth every morning. Deeann Saint, MD  Active   amphetamine-dextroamphetamine (ADDERALL XR) 20 MG 24 hr capsule [id number]  Take 1 capsule (20 mg total) by mouth every morning. Deeann Saint, MD  Active    amphetamine-dextroamphetamine (ADDERALL XR) 20 MG 24 hr capsule [id number]  Take 1 capsule (20 mg total) by mouth every morning. Deeann Saint, MD  Active   cyanocobalamin (VITAMIN B12) 1000 MCG tablet [id number] Yes Take 1,000 mcg by mouth daily. [provider] Taking Active Self  DULoxetine (CYMBALTA) 30 MG capsule [id number] Yes Take 30 mg by mouth daily. Take 1 capsule (30 mg total) by mouth daily. Increase to 60 mg daily in 1 week [provider] Taking Active Self  ergocalciferol (VITAMIN D2) 1.25 MG (50000 UT) capsule [id number] Yes Take 50,000 Units by mouth once a week. [provider] Taking Active Self  hydrOXYzine (ATARAX) 25 MG tablet [id number]  Take 1-2 tabs (25-50 mg) three times per day as needed for anxiety. Deeann Saint, MD  Active   ibuprofen (ADVIL) 800 MG tablet [id number]  Take 800 mg by mouth every 8 (eight) hours as needed. [provider]  Active     Discontinued 12/17/20 1342 memantine (NAMENDA) 5 MG tablet [id number] Yes Take 5 mg by mouth daily. Take 1 tablet (5 mg total) by mouth daily. Increase to 10 mg daily in 1 week [provider] Taking Active Self  omeprazole (PRILOSEC) 20 MG capsule [id number] No Take by mouth.  Patient not taking: Reported on 10/31/2023   [provider] Not Taking Active   oxyCODONE (OXY IR/ROXICODONE) 5 MG immediate release tablet [id number] Yes Take 5 mg by mouth every 8 (eight) hours as needed for severe pain (pain score 7-10) or moderate pain (pain score 4-6). [provider] Taking Active Self  zolpidem (AMBIEN) 5 MG tablet [id number] Yes Take 5 mg by mouth at bedtime as needed for sleep. [provider] Taking Active Self  Home Care and Equipment/Supplies: Were Home Health Services Ordered?: Yes Name of Home Health Agency:: pt geting outpt therapy-[facility] Any new equipment or medical supplies ordered?: NA  Functional  Questionnaire: Do you need assistance with bathing/showering or dressing?: No Do you need assistance with meal preparation?: No Do you need assistance with eating?: No Do you have difficulty maintaining continence: No Do you need assistance with getting out of bed/getting out of a chair/moving?: No Do you have difficulty managing or taking your medications?: No  Follow up appointments reviewed: PCP Follow-up appointment confirmed?: No (encouraged pt to consider making appt with PCP-did not want to schedule during this call) MD Provider Line Number:[phone] Given: No Specialist Hospital Follow-up appointment confirmed?: Yes Date of Specialist follow-up appointment?: 11/07/23 Follow-Up Specialty Provider:: pt unsure of provider name but reports app[facility] Do you need transportation to your follow-up appointment?: No Do you understand care options if your condition(s) worsen?: Yes-patient verbalized understanding  SDOH Interventions Today    Flowsheet Row Most Recent Value  SDOH Interventions   Food Insecurity Interventions Intervention Not Indicated  Transportation Interventions Intervention Not Indicated      TOC interventions discussed/reviewed: -Provided Verbal Education: pain/sx mgmt, medications & their functions -Doctor visit discussed/reviewed -PCP -Doctor visits discussed/reviewed-Specialist  Antionette Fairy, RN,BSN,CCM RN Care Manager Transitions of Care  [place]  Direct Phone: [phone] Toll Free: [phone] Fax: [phone]

## 2024-02-16 ENCOUNTER — Encounter: Payer: Self-pay | Admitting: Family Medicine

## 2024-02-16 ENCOUNTER — Telehealth: Payer: BC Managed Care – PPO | Admitting: Family Medicine

## 2024-02-16 DIAGNOSIS — F900 Attention-deficit hyperactivity disorder, predominantly inattentive type: Secondary | ICD-10-CM | POA: Diagnosis not present

## 2024-02-16 DIAGNOSIS — F32A Depression, unspecified: Secondary | ICD-10-CM | POA: Diagnosis not present

## 2024-02-16 DIAGNOSIS — F419 Anxiety disorder, unspecified: Secondary | ICD-10-CM

## 2024-02-16 DIAGNOSIS — R61 Generalized hyperhidrosis: Secondary | ICD-10-CM

## 2024-02-16 MED ORDER — LISDEXAMFETAMINE DIMESYLATE 30 MG PO CAPS
30.0000 mg | ORAL_CAPSULE | Freq: Every day | ORAL | 0 refills | Status: DC
Start: 2024-02-16 — End: 2024-02-23

## 2024-02-16 MED ORDER — LISDEXAMFETAMINE DIMESYLATE 30 MG PO CAPS: 30.0000 mg | ORAL_CAPSULE | Freq: Every day | ORAL | 0 refills | Status: DC

## 2024-02-16 NOTE — Progress Notes (Signed)
 Virtual Visit via Video Note  I connected with Chloe Sanders on 02/16/24 at  9:15 AM EST by a video enabled telemedicine application and verified that I am speaking with the correct person using two identifiers.  Location patient: home Location provider:work or home office Persons participating in the virtual visit: patient, provider  I discussed the limitations of evaluation and management by telemedicine and the availability of in person appointments. The patient expressed understanding and agreed to proceed.  Chief Complaint  Patient presents with   Medication Refill    HPI: Patient is a 23 year old female seen for med refill and follow-up. Pt thinks Adderall XR 20 mg is too much.  Feels like she gets stuff done but has no personality.  Would like to try vyvanse.  In the past her psychiatrist tried to put her on it but it wasn't covered by insurance.  Patient has not been attending counseling due to cost.  States she has a high deductible with her insurance plan which makes it difficult.  Patient was hesitant to try counseling on campus.  States has not been on campus in a while.  Patient also mentions recent stress as it relates to her GF's family dynamic.  Patient has not come out to her family.  Also notes stress related to her brother's health.  ROS: See pertinent positives and negatives per HPI.  Past Medical History:  Diagnosis Date   Asthma    Chiari malformation type II (HCC)    I or II per patient   COVID-19    CSF leak    Dysautonomia (HCC)    Headache     Past Surgical History:  Procedure Laterality Date   Chiari Decompression  05/2016    Family History  Problem Relation Age of Onset   Hypertension Mother    Cancer Father    Hypertension Father    High blood pressure Brother      Current Outpatient Medications:    acetaminophen (TYLENOL) 325 MG tablet, Take 650-975 mg by mouth as needed for fever, mild pain or headache., Disp: , Rfl:     amphetamine-dextroamphetamine (ADDERALL XR) 20 MG 24 hr capsule, Take 1 capsule (20 mg total) by mouth every morning., Disp: 30 capsule, Rfl: 0   amphetamine-dextroamphetamine (ADDERALL XR) 20 MG 24 hr capsule, Take 1 capsule (20 mg total) by mouth every morning., Disp: 30 capsule, Rfl: 0   amphetamine-dextroamphetamine (ADDERALL XR) 20 MG 24 hr capsule, Take 1 capsule (20 mg total) by mouth every morning., Disp: 30 capsule, Rfl: 0   cyanocobalamin (VITAMIN B12) 1000 MCG tablet, Take 1,000 mcg by mouth daily., Disp: , Rfl:    DULoxetine (CYMBALTA) 30 MG capsule, Take 30 mg by mouth daily. Take 1 capsule (30 mg total) by mouth daily. Increase to 60 mg daily in 1 week, Disp: , Rfl:    ergocalciferol (VITAMIN D2) 1.25 MG (50000 UT) capsule, Take 50,000 Units by mouth once a week., Disp: , Rfl:    hydrOXYzine (ATARAX) 25 MG tablet, Take 1-2 tabs (25-50 mg) three times per day as needed for anxiety., Disp: 90 tablet, Rfl: 0   ibuprofen (ADVIL) 800 MG tablet, Take 800 mg by mouth every 8 (eight) hours as needed., Disp: , Rfl:    memantine (NAMENDA) 5 MG tablet, Take 5 mg by mouth daily. Take 1 tablet (5 mg total) by mouth daily. Increase to 10 mg daily in 1 week, Disp: , Rfl:    omeprazole (PRILOSEC) 20 MG capsule, Take by  mouth. (Patient not taking: Reported on 02/16/2024), Disp: , Rfl:    oxyCODONE (OXY IR/ROXICODONE) 5 MG immediate release tablet, Take 5 mg by mouth every 8 (eight) hours as needed for severe pain (pain score 7-10) or moderate pain (pain score 4-6). (Patient not taking: Reported on 02/16/2024), Disp: , Rfl:    zolpidem (AMBIEN) 5 MG tablet, Take 5 mg by mouth at bedtime as needed for sleep. (Patient not taking: Reported on 02/16/2024), Disp: , Rfl:   EXAM:  VITALS per patient if applicable:  RR between 12-20 bpm   GENERAL: alert, oriented, appears well and in no acute distress  HEENT: atraumatic, conjunctiva clear, no obvious abnormalities on inspection of external nose and ears  NECK:  normal movements of the head and neck  LUNGS: on inspection no signs of respiratory distress, breathing rate appears normal, no obvious gross SOB, gasping or wheezing  CV: no obvious cyanosis  MS: moves all visible extremities without noticeable abnormality  PSYCH/NEURO: pleasant and cooperative, no obvious depression or anxiety, speech and thought processing grossly intact  ASSESSMENT AND PLAN:  Discussed the following assessment and plan:  Attention deficit hyperactivity disorder (ADHD), predominantly inattentive type - Plan: lisdexamfetamine (VYVANSE) 30 MG capsule, lisdexamfetamine (VYVANSE) 30 MG capsule, lisdexamfetamine (VYVANSE) 30 MG capsule, Ambulatory referral to Social Work  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  Will discontinue Adderall XR 20 mg as patient feels is affecting her personality.  Will see if  Vyvanse 30 mg daily is covered by insurance.  Slight improvement in anxiety and depression compared to previous visits.  Patient encouraged to find a psychiatrist and counselor in [place] area given ongoing battle with anxiety/depression.  Referral to social work placed for resources due to financial strain.  Discussed referral to dermatology for possible Botox injections for hyperhidrosis.   Follow-up as needed in 3 months, sooner if needed.  I discussed the assessment and treatment plan with the patient. The patient was provided an opportunity to ask questions and all were answered. The patient agreed with the plan and demonstrated an understanding of the instructions.   The patient was advised to call back or seek an in-person evaluation if the symptoms worsen or if the condition fails to improve as anticipated.   Deeann Saint, MD

## 2024-02-16 NOTE — Progress Notes (Signed)
 Patient was unable to self-report due to a lack of equipment at home via telehealth

## 2024-02-17 ENCOUNTER — Telehealth: Payer: Self-pay

## 2024-02-17 ENCOUNTER — Telehealth: Payer: Self-pay | Admitting: *Deleted

## 2024-02-17 NOTE — Progress Notes (Signed)
 Complex Care Management Note Care Guide Note  02/17/2024 Name: Chloe Sanders MRN: [id number] DOB: 2001-01-23   Complex Care Management Outreach Attempts: An unsuccessful telephone outreach was attempted today to offer the patient information about available complex care management services.  Follow Up Plan:  Additional outreach attempts will be made to offer the patient complex care management information and services.   Encounter Outcome:  No Answer  Burman Nieves, CMA, Care Guide [facility]  [facility], [facility] Guide Direct Dial: [phone]  Fax: [phone] Website: [email or web address]

## 2024-02-17 NOTE — Telephone Encounter (Signed)
 Copied from CRM [id number]. Topic: Clinical - Medication Question >> Feb 17, 2024 10:54 AM Alcus Dad wrote: Reason for CRM: Patient stated that she was going to be put on Vyvanse but it was $500 without insurance. Patient is requesting a lower dose of adderall instead of vyvansen because of cost

## 2024-02-20 NOTE — Progress Notes (Unsigned)
 Complex Care Management Note Care Guide Note  02/20/2024 Name: Chloe Sanders MRN: [id number] DOB: [birth year]   Complex Care Management Outreach Attempts: A second unsuccessful outreach was attempted today to offer the patient with information about available complex care management services.  Follow Up Plan:  Additional outreach attempts will be made to offer the patient complex care management information and services.   Encounter Outcome:  No Answer  Burman Nieves, CMA, Care Guide [facility]  [facility], [facility] Guide Direct Dial: [phone]  Fax: [phone] Website: [email or web address]

## 2024-02-21 NOTE — Progress Notes (Signed)
 Complex Care Management Note Care Guide Note  02/21/2024 Name: Chloe Sanders MRN: [id number] DOB: 2001/05/08   Complex Care Management Outreach Attempts: A third unsuccessful outreach was attempted today to offer the patient with information about available complex care management services.  Follow Up Plan:  No further outreach attempts will be made at this time. We have been unable to contact the patient to offer or enroll patient in complex care management services.  Encounter Outcome:  No Answer  Burman Nieves, CMA, Care Guide [facility]  [facility], [facility] Guide Direct Dial: [phone]  Fax: [phone] Website: [email or web address]

## 2024-02-23 ENCOUNTER — Other Ambulatory Visit: Payer: Self-pay | Admitting: Family Medicine

## 2024-02-23 DIAGNOSIS — F900 Attention-deficit hyperactivity disorder, predominantly inattentive type: Secondary | ICD-10-CM

## 2024-02-23 DIAGNOSIS — F411 Generalized anxiety disorder: Secondary | ICD-10-CM

## 2024-02-23 MED ORDER — AMPHETAMINE-DEXTROAMPHET ER 15 MG PO CP24: 15.0000 mg | ORAL_CAPSULE | ORAL | 0 refills | Status: AC

## 2024-02-23 MED ORDER — AMPHETAMINE-DEXTROAMPHET ER 15 MG PO CP24
15.0000 mg | ORAL_CAPSULE | ORAL | 0 refills | Status: AC
Start: 2024-02-23 — End: ?

## 2024-02-23 MED ORDER — HYDROXYZINE HCL 25 MG PO TABS
ORAL_TABLET | ORAL | 0 refills | Status: AC
Start: 2024-02-23 — End: ?

## 2024-02-24 NOTE — Telephone Encounter (Signed)
 Matter addressed

## 2024-05-04 ENCOUNTER — Telehealth: Payer: Self-pay

## 2024-05-04 DIAGNOSIS — G43009 Migraine without aura, not intractable, without status migrainosus: Secondary | ICD-10-CM

## 2024-05-08 ENCOUNTER — Telehealth: Payer: Self-pay | Admitting: *Deleted

## 2024-05-08 NOTE — Progress Notes (Unsigned)
 Complex Care Management Note Care Guide Note  05/08/2024 Name: Chloe Sanders MRN: [id number] DOB: 10-14-2001   Complex Care Management Outreach Attempts: An unsuccessful telephone outreach was attempted today to offer the patient information about available complex care management services.  Follow Up Plan:  Additional outreach attempts will be made to offer the patient complex care management information and services.   Encounter Outcome:  No Answer  Kandis Ormond, CMA [place]  [facility], [facility] Guide Direct Dial: [phone]  Fax: [phone] Website: [email or web address]

## 2024-05-10 NOTE — Progress Notes (Unsigned)
 Complex Care Management Note Care Guide Note  05/10/2024 Name: Chloe Sanders MRN: [id number] DOB: 2001/04/23   Complex Care Management Outreach Attempts: A second unsuccessful outreach was attempted today to offer the patient with information about available complex care management services.  Follow Up Plan:  Additional outreach attempts will be made to offer the patient complex care management information and services.   Encounter Outcome:  No Answer  Kandis Ormond, CMA [place]  [facility], [facility] Guide Direct Dial: [phone]  Fax: [phone] Website: [email or web address]

## 2024-05-16 NOTE — Progress Notes (Signed)
 Complex Care Management Note Care Guide Note  05/16/2024 Name: Chloe Sanders MRN: [id number] DOB: 08-Oct-2001   Complex Care Management Outreach Attempts: A third unsuccessful outreach was attempted today to offer the patient with information about available complex care management services.  Follow Up Plan:  No further outreach attempts will be made at this time. We have been unable to contact the patient to offer or enroll patient in complex care management services.  Encounter Outcome:  No Answer  Kandis Ormond, CMA [place]  [facility], [facility] Guide Direct Dial: [phone]  Fax: [phone] Website: [email or web address]
